# Patient Record
Sex: Female | Born: 1970 | Hispanic: Yes | Smoking: Never smoker
Health system: Southern US, Community
[De-identification: ages and names within clinical notes are randomized; demographics above are authoritative.]

## PROBLEM LIST (undated history)

## (undated) DIAGNOSIS — G43909 Migraine, unspecified, not intractable, without status migrainosus: Secondary | ICD-10-CM

## (undated) DIAGNOSIS — M069 Rheumatoid arthritis, unspecified: Secondary | ICD-10-CM

## (undated) DIAGNOSIS — E079 Disorder of thyroid, unspecified: Secondary | ICD-10-CM

## (undated) HISTORY — DX: Migraine, unspecified, not intractable, without status migrainosus: G43.909

## (undated) HISTORY — DX: Rheumatoid arthritis, unspecified: M06.9

## (undated) HISTORY — DX: Disorder of thyroid, unspecified: E07.9

---

## 1998-12-21 ENCOUNTER — Emergency Department (HOSPITAL_COMMUNITY): Admission: EM | Admit: 1998-12-21 | Discharge: 1998-12-21 | Payer: Self-pay | Admitting: Emergency Medicine

## 2000-04-21 ENCOUNTER — Ambulatory Visit (HOSPITAL_COMMUNITY): Admission: RE | Admit: 2000-04-21 | Discharge: 2000-04-21 | Payer: Self-pay | Admitting: *Deleted

## 2000-08-12 ENCOUNTER — Inpatient Hospital Stay (HOSPITAL_COMMUNITY): Admission: AD | Admit: 2000-08-12 | Discharge: 2000-08-14 | Payer: Self-pay | Admitting: Obstetrics & Gynecology

## 2000-08-16 ENCOUNTER — Inpatient Hospital Stay (HOSPITAL_COMMUNITY): Admission: AD | Admit: 2000-08-16 | Discharge: 2000-08-16 | Payer: Self-pay | Admitting: *Deleted

## 2001-04-03 ENCOUNTER — Emergency Department (HOSPITAL_COMMUNITY): Admission: EM | Admit: 2001-04-03 | Discharge: 2001-04-03 | Payer: Self-pay | Admitting: Emergency Medicine

## 2001-04-04 ENCOUNTER — Encounter: Payer: Self-pay | Admitting: Emergency Medicine

## 2002-06-11 ENCOUNTER — Encounter: Payer: Self-pay | Admitting: Emergency Medicine

## 2002-06-11 ENCOUNTER — Emergency Department (HOSPITAL_COMMUNITY): Admission: EM | Admit: 2002-06-11 | Discharge: 2002-06-12 | Payer: Self-pay | Admitting: Emergency Medicine

## 2002-09-17 ENCOUNTER — Other Ambulatory Visit: Admission: RE | Admit: 2002-09-17 | Discharge: 2002-09-17 | Payer: Self-pay | Admitting: Gynecology

## 2004-06-09 ENCOUNTER — Emergency Department (HOSPITAL_COMMUNITY): Admission: EM | Admit: 2004-06-09 | Discharge: 2004-06-09 | Payer: Self-pay | Admitting: Emergency Medicine

## 2005-02-23 ENCOUNTER — Encounter: Admission: RE | Admit: 2005-02-23 | Discharge: 2005-02-23 | Payer: Self-pay | Admitting: Occupational Medicine

## 2006-07-29 IMAGING — CR DG SHOULDER 2+V*R*
3 series · 3 of 3 positions shown · non-contrast
Comparison: none

CLINICAL DATA: Right shoulder pain after injury. 
 RIGHT SHOULDER ? 3 VIEWS:
 The AC joint is intact and the subacromial space is maintained.  There is no evidence of fracture or dislocation.

[view not recorded (1 of 3)]
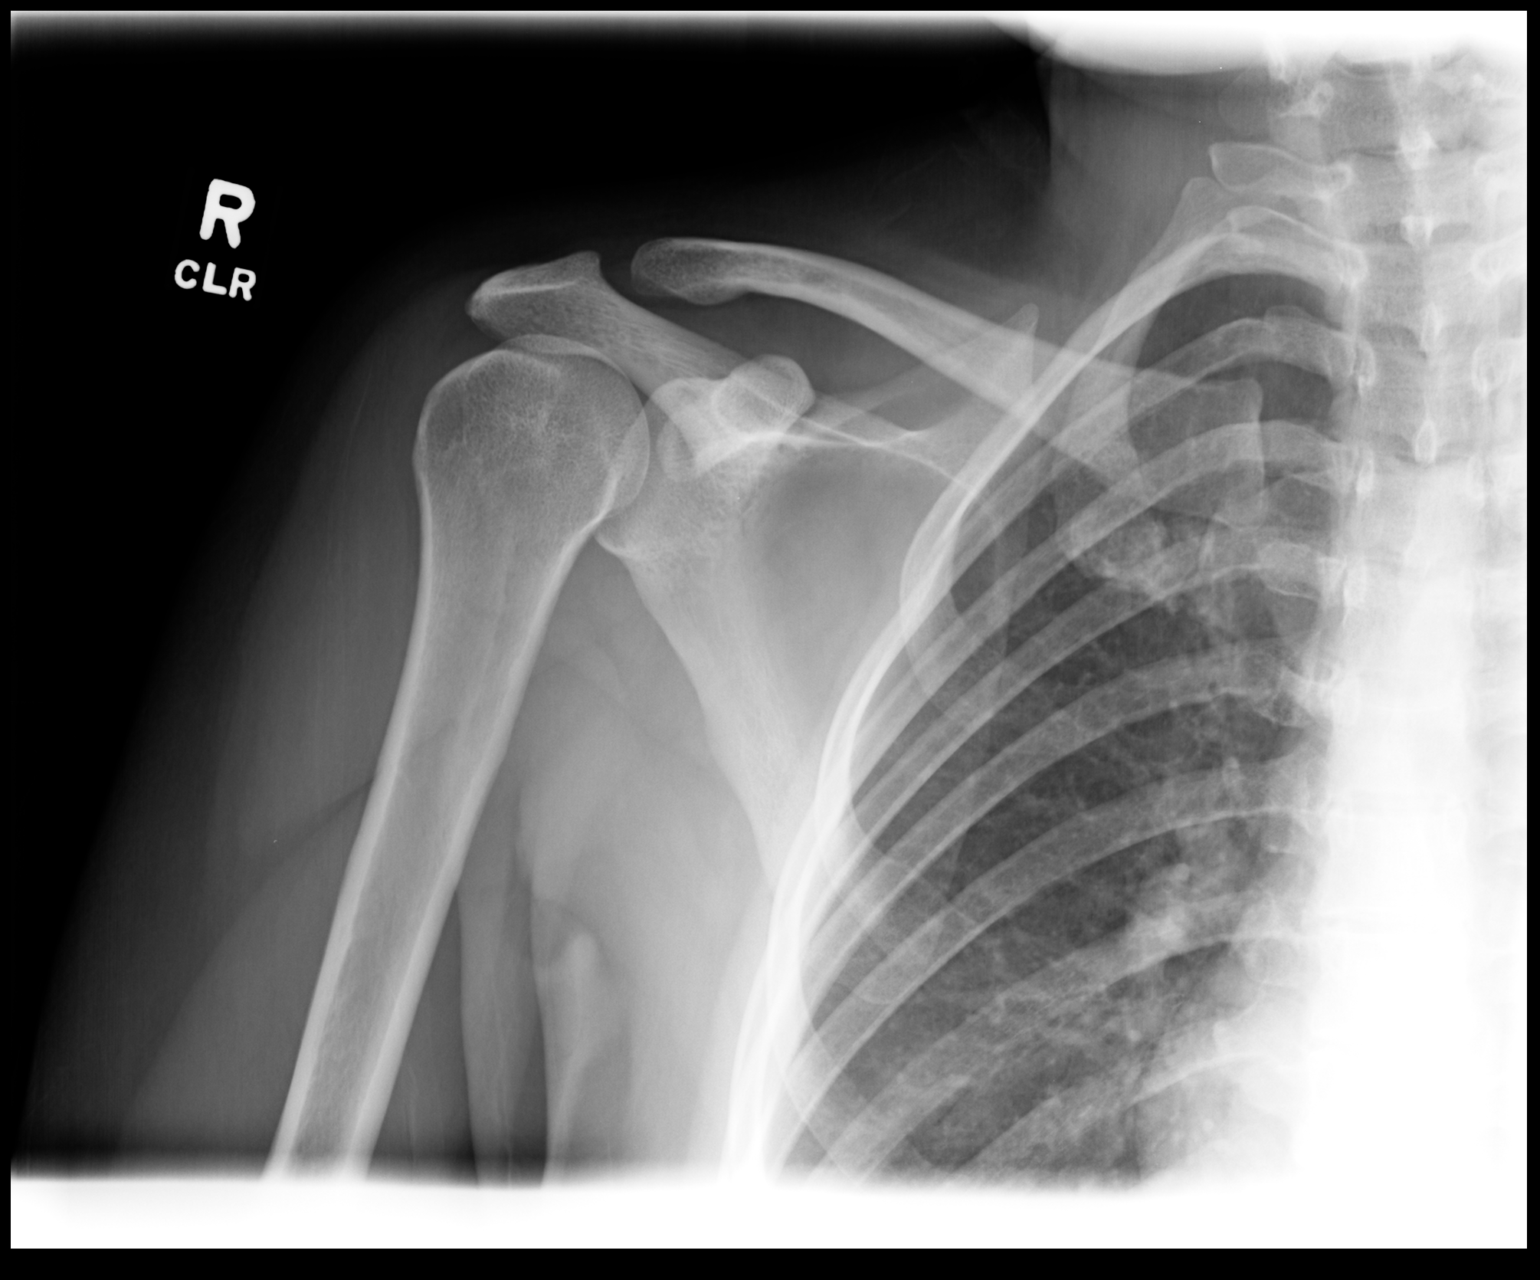

[view not recorded (2 of 3)]
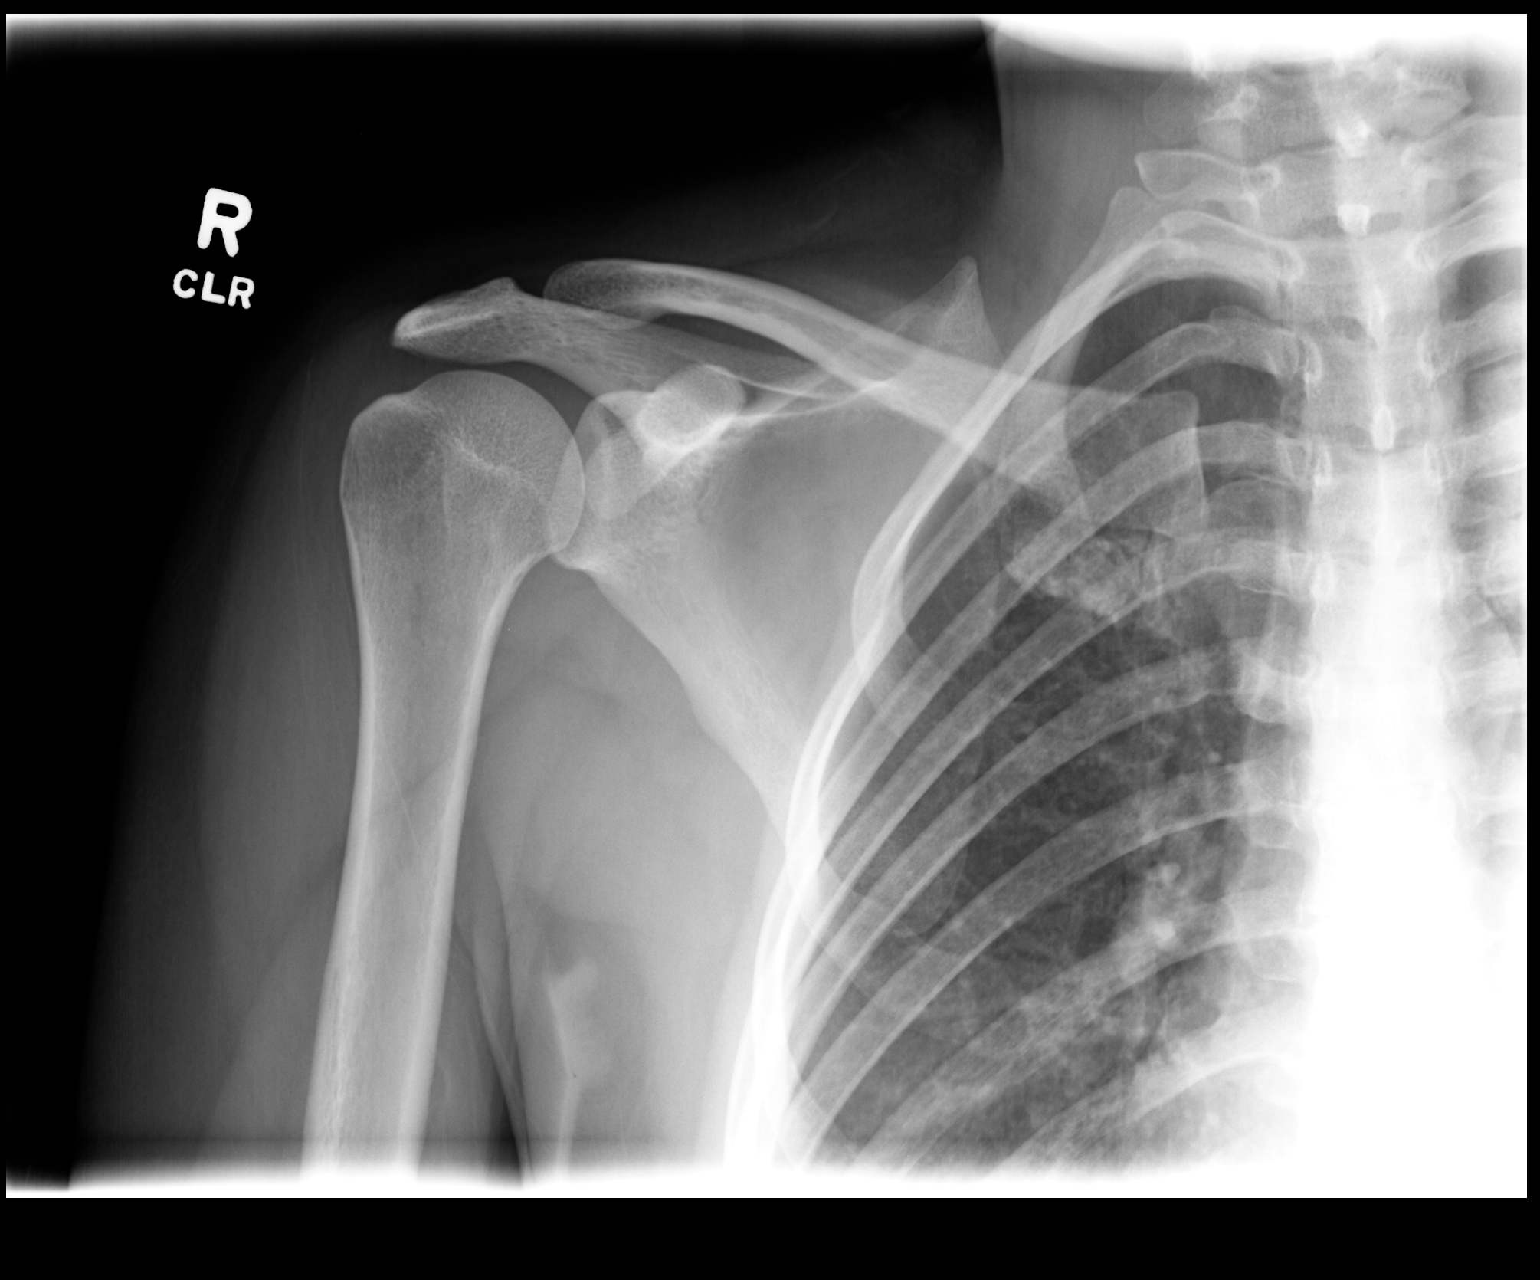

[view not recorded (3 of 3)]
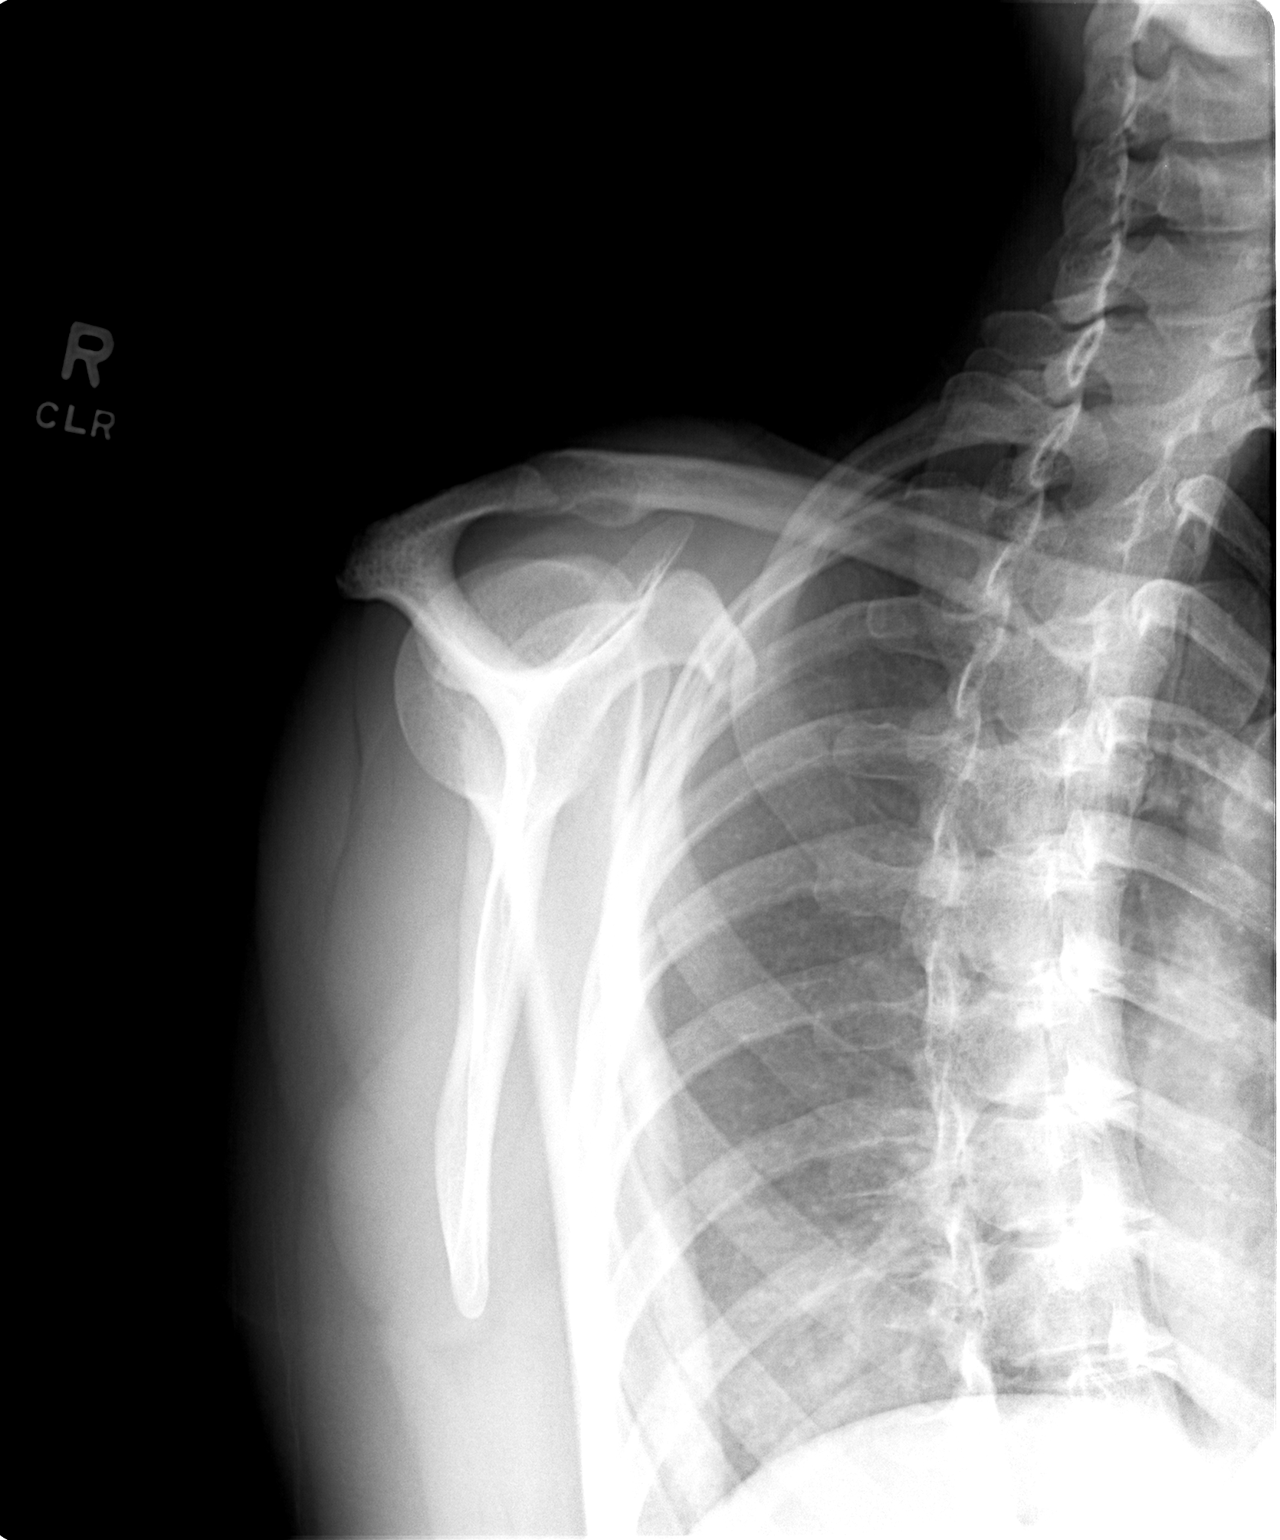

[3 of 3 positions shown; findings below may reference images not displayed]

IMPRESSION: Normal right shoulder.

## 2013-03-14 ENCOUNTER — Encounter (HOSPITAL_BASED_OUTPATIENT_CLINIC_OR_DEPARTMENT_OTHER): Payer: Self-pay

## 2013-03-14 ENCOUNTER — Emergency Department (HOSPITAL_BASED_OUTPATIENT_CLINIC_OR_DEPARTMENT_OTHER)
Admission: EM | Admit: 2013-03-14 | Discharge: 2013-03-14 | Disposition: A | Discharge status: Home / Self Care | Payer: Commercial Managed Care - PPO | Attending: Emergency Medicine | Admitting: Emergency Medicine

## 2013-03-14 DIAGNOSIS — R071 Chest pain on breathing: Secondary | ICD-10-CM | POA: Insufficient documentation

## 2013-03-14 DIAGNOSIS — R05 Cough: Secondary | ICD-10-CM | POA: Insufficient documentation

## 2013-03-14 DIAGNOSIS — R0789 Other chest pain: Secondary | ICD-10-CM

## 2013-03-14 DIAGNOSIS — R0989 Other specified symptoms and signs involving the circulatory and respiratory systems: Secondary | ICD-10-CM | POA: Insufficient documentation

## 2013-03-14 DIAGNOSIS — R0609 Other forms of dyspnea: Secondary | ICD-10-CM | POA: Insufficient documentation

## 2013-03-14 DIAGNOSIS — J209 Acute bronchitis, unspecified: Secondary | ICD-10-CM | POA: Insufficient documentation

## 2013-03-14 DIAGNOSIS — R059 Cough, unspecified: Secondary | ICD-10-CM | POA: Insufficient documentation

## 2013-03-14 LAB — COMPREHENSIVE METABOLIC PANEL
Albumin: 4 g/dL (ref 3.5–5.2)
BUN: 10 mg/dL (ref 6–23)
Chloride: 105 mEq/L (ref 96–112)
Creatinine, Ser: 0.8 mg/dL (ref 0.50–1.10)
GFR calc non Af Amer: 90 mL/min — ABNORMAL LOW (ref >90)
Total Bilirubin: 0.4 mg/dL (ref 0.3–1.2)

## 2013-03-14 LAB — CBC WITH DIFFERENTIAL/PLATELET
Basophils Relative: 0 % (ref 0–1)
Eosinophils Relative: 2 % (ref 0–5)
HCT: 40 % (ref 36.0–46.0)
Hemoglobin: 14.4 g/dL (ref 12.0–15.0)
MCH: 31.2 pg (ref 26.0–34.0)
MCHC: 36 g/dL (ref 30.0–36.0)
MCV: 86.6 fL (ref 78.0–100.0)
Monocytes Absolute: 0.7 10*3/uL (ref 0.1–1.0)
Monocytes Relative: 9 % (ref 3–12)
Neutro Abs: 5.2 10*3/uL (ref 1.7–7.7)

## 2013-03-14 LAB — TROPONIN I: Troponin I: <0.3 ng/mL (ref <0.30)

## 2013-03-14 MED ORDER — OXYCODONE-ACETAMINOPHEN 5-325 MG PO TABS: 1.0000 | ORAL_TABLET | ORAL | Status: DC | PRN

## 2013-03-14 MED ORDER — IPRATROPIUM BROMIDE 0.02 % IN SOLN
0.5000 mg | Freq: Once | RESPIRATORY_TRACT | Status: AC
  Administered 2013-03-14: 0.5 mg via RESPIRATORY_TRACT
  Filled 2013-03-14: qty 2.5

## 2013-03-14 MED ORDER — ALBUTEROL SULFATE (5 MG/ML) 0.5% IN NEBU
2.5000 mg | INHALATION_SOLUTION | Freq: Once | RESPIRATORY_TRACT | Status: AC
  Administered 2013-03-14: 2.5 mg via RESPIRATORY_TRACT
  Filled 2013-03-14: qty 0.5

## 2013-03-14 MED ORDER — ALBUTEROL SULFATE HFA 108 (90 BASE) MCG/ACT IN AERS: 2.0000 | INHALATION_SPRAY | RESPIRATORY_TRACT | Status: AC | PRN

## 2013-03-14 MED ORDER — NAPROXEN 500 MG PO TABS: 500.0000 mg | ORAL_TABLET | Freq: Two times a day (BID) | ORAL | Status: DC

## 2013-03-14 MED ORDER — KETOROLAC TROMETHAMINE 30 MG/ML IJ SOLN
30.0000 mg | Freq: Once | INTRAMUSCULAR | Status: AC
  Administered 2013-03-14: 30 mg via INTRAVENOUS
  Filled 2013-03-14: qty 1

## 2013-03-14 NOTE — ED Provider Notes (Signed)
History     CSN: [id number]  Arrival date & time 03/14/13  1021   First MD Initiated Contact with Patient 03/14/13 1127      Chief Complaint  Patient presents with  . Chest Pain    (Consider location/radiation/quality/duration/timing/severity/associated sxs/prior treatment) Patient is a 42 y.o. female presenting with chest pain. The history is provided by the patient.  Chest Pain She had onset yesterday of a sharp left-sided chest pain. It is worse in the scapular area but also present in the front. It is worse with deep breathing and worse with coughing. There is associated dyspnea but no fever, chills, sweats. Cough that is present and nonproductive. There's been no nausea, vomiting, and diarrhea. There has been no aching anywhere else. Pain is moderately severe and she rates it at 9/10. She went to an urgent care Center and was referred here. She is a nonsmoker. She has not had any recent surgery or long distance travel. She does not have history of cancer and is not on oral contraceptives. Of note, she did take a dose of ibuprofen with no relief.  History reviewed. No pertinent past medical history.  History reviewed. No pertinent past surgical history.  No family history on file.  History  Substance Use Topics  . Smoking status: Never Smoker   . Smokeless tobacco: Not on file  . Alcohol Use: No    OB History   Grav Para Term Preterm Abortions TAB SAB Ect Mult Living                  Review of Systems  Cardiovascular: Positive for chest pain.  All other systems reviewed and are negative.    Allergies  Review of patient's allergies indicates no known allergies.  Home Medications  No current outpatient prescriptions on file.  BP 113/59  Pulse 73  Temp(Src) 98.2 F (36.8 C) (Oral)  Resp 16  Ht 5\' 5"  (1.651 m)  Wt 160 lb (72.576 kg)  BMI 26.63 kg/m2  SpO2 100%  Physical Exam  Nursing note and vitals reviewed.  42 year old female, resting comfortably and  in no acute distress. Vital signs are normal. Oxygen saturation is 100%, which is normal. Head is normocephalic and atraumatic. PERRLA, EOMI. Oropharynx is clear. Neck is nontender and supple without adenopathy or JVD. Back is nontender in the midline and there is no CVA tenderness. There is moderate tenderness in the left scapular area. Lungs are clear without rales, wheezes, or rhonchi. When she coughs, mild wheezing is noted. Chest is mildly tender in the left parasternal area. Heart has regular rate and rhythm without murmur. Abdomen is soft, flat, nontender without masses or hepatosplenomegaly and peristalsis is normoactive. Extremities have no cyanosis or edema, full range of motion is present. Skin is warm and dry without rash. Neurologic: Mental status is normal, cranial nerves are intact, there are no motor or sensory deficits.  ED Course  Procedures (including critical care time)  Results for orders placed during the hospital encounter of 03/14/13  CBC WITH DIFFERENTIAL      Result Value Range   WBC 7.0  4.0 - 10.5 K/uL   RBC 4.62  3.87 - 5.11 MIL/uL   Hemoglobin 14.4  12.0 - 15.0 g/dL   HCT 11.9  14.7 - 82.9 %   MCV 86.6  78.0 - 100.0 fL   MCH 31.2  26.0 - 34.0 pg   MCHC 36.0  30.0 - 36.0 g/dL   RDW 56.2  13.0 -  15.5 %   Platelets 224  150 - 400 K/uL   Neutrophils Relative 74  43 - 77 %   Neutro Abs 5.2  1.7 - 7.7 K/uL   Lymphocytes Relative 14  12 - 46 %   Lymphs Abs 1.0  0.7 - 4.0 K/uL   Monocytes Relative 9  3 - 12 %   Monocytes Absolute 0.7  0.1 - 1.0 K/uL   Eosinophils Relative 2  0 - 5 %   Eosinophils Absolute 0.2  0.0 - 0.7 K/uL   Basophils Relative 0  0 - 1 %   Basophils Absolute 0.0  0.0 - 0.1 K/uL   ECG shows normal sinus rhythm with a rate of 66, no ectopy. Normal axis. Normal P wave. Normal QRS. Normal intervals. Normal ST and T waves. Impression: normal ECG. When compared with ECG of 06/09/2004, no significant changes are seen.   1. Acute bronchitis    2. Chest wall pain       MDM  Chest wall pain likely related to respiratory tract infection. She is PERC Negative so does not need d-dimer or a CT angiogram to rule out pulmonary embolism. She'll be given empiric trial of albuterol with Atrovent and.ketorolac for pain. Chest x-ray from her urgent care Center was reviewed and is normal by my interpretation.  She got good relief with the above-noted medication. Laboratory workup is unremarkable. She is discharged with prescription for albuterol inhaler, naproxen, and Percocet.        Dione Booze, MD 03/14/13 [phone]

## 2013-03-14 NOTE — ED Notes (Signed)
Per lab staff, labs hemolyzed.  Labs recollected.

## 2013-03-14 NOTE — ED Notes (Signed)
Pt reports an onset left upper back pain radiating to left chest wall yesterday and continuing today.  [facility], had a CXR that showed a widened mediastinum on lateral view per physician assistant.

## 2018-05-07 ENCOUNTER — Emergency Department (HOSPITAL_COMMUNITY)
Admission: EM | Admit: 2018-05-07 | Discharge: 2018-05-07 | Disposition: A | Discharge status: Home / Self Care | Payer: Worker's Compensation | Attending: Emergency Medicine | Admitting: Emergency Medicine

## 2018-05-07 ENCOUNTER — Encounter (HOSPITAL_COMMUNITY): Payer: Self-pay | Admitting: Emergency Medicine

## 2018-05-07 ENCOUNTER — Emergency Department (HOSPITAL_COMMUNITY): Payer: Worker's Compensation

## 2018-05-07 DIAGNOSIS — S6991XA Unspecified injury of right wrist, hand and finger(s), initial encounter: Secondary | ICD-10-CM

## 2018-05-07 DIAGNOSIS — S63634A Sprain of interphalangeal joint of right ring finger, initial encounter: Secondary | ICD-10-CM

## 2018-05-07 DIAGNOSIS — Y929 Unspecified place or not applicable: Secondary | ICD-10-CM | POA: Insufficient documentation

## 2018-05-07 DIAGNOSIS — W2209XA Striking against other stationary object, initial encounter: Secondary | ICD-10-CM | POA: Diagnosis not present

## 2018-05-07 DIAGNOSIS — Y990 Civilian activity done for income or pay: Secondary | ICD-10-CM | POA: Diagnosis not present

## 2018-05-07 DIAGNOSIS — Y939 Activity, unspecified: Secondary | ICD-10-CM | POA: Insufficient documentation

## 2018-05-07 DIAGNOSIS — Z79899 Other long term (current) drug therapy: Secondary | ICD-10-CM | POA: Insufficient documentation

## 2018-05-07 MED ORDER — IBUPROFEN 600 MG PO TABS: 600.0000 mg | ORAL_TABLET | Freq: Four times a day (QID) | ORAL | 0 refills | Status: AC | PRN

## 2018-05-07 MED ORDER — IBUPROFEN 200 MG PO TABS
600.0000 mg | ORAL_TABLET | Freq: Once | ORAL | Status: AC
  Administered 2018-05-07: 600 mg via ORAL
  Filled 2018-05-07: qty 3

## 2018-05-07 NOTE — Discharge Instructions (Signed)
X-ray shows no evidence of fracture dislocation this is likely a finger sprain.  You may use the splint to help protect the finger while it heals.  Ice and elevate the finger as much as possible you may use ibuprofen as needed for pain as well as Tylenol.  If pain is not improving please follow-up with your primary care doctor for reevaluation.  If you have severely worsened pain, redness warmth or swelling of the finger that spreads onto the hand, fevers or any other new or concerning symptoms return to the ED for reevaluation.

## 2018-05-07 NOTE — ED Triage Notes (Addendum)
Patient reports hitting right fourth finger on trash can at work yesterday. Minimal swelling noted to finger. Movement and sensation present.

## 2018-05-07 NOTE — ED Provider Notes (Signed)
[facility]-EMERGENCY DEPT Provider Note   CSN: [id number] Arrival date & time: 05/07/18  1500     History   Chief Complaint Chief Complaint  Patient presents with  . Finger Injury    HPI Lindsey Austin is a 47 y.o. female.  Lindsey Austin is a 47 y.o. Female who is otherwise healthy, presents to the emergency department for evaluation of pain and swelling in her right ring finger.  She reports while at work yesterday she jammed it on a trash can and since then she has had a constant dull ache, pain is made worse with palpation or range of motion.  Patient reports some mild decreased sensation intermittently, denies any tingling or weakness, she is able to minimally bend and extend the finger, but this is limited by pain.  She denies any cuts or abrasions to the finger, no redness or warmth associated with the swelling.  No pain in the hand or injury to any other fingers, no previous injury or surgeries.  She has not taken anything prior to arrival to treat her pain, no other aggravating or alleviating factors.     History reviewed. No pertinent past medical history.  There are no active problems to display for this patient.   History reviewed. No pertinent surgical history.   OB History   None      Home Medications    Prior to Admission medications   Medication Sig Start Date End Date Taking? Authorizing Provider  albuterol (PROVENTIL HFA;VENTOLIN HFA) 108 (90 BASE) MCG/ACT inhaler Inhale 2 puffs into the lungs every 4 (four) hours as needed for wheezing or shortness of breath (or coughing). 03/14/13   Dione Booze, MD  naproxen (NAPROSYN) 500 MG tablet Take 1 tablet (500 mg total) by mouth 2 (two) times daily. 03/14/13   Dione Booze, MD  oxyCODONE-acetaminophen (ROXICET) 5-325 MG per tablet Take 1 tablet by mouth every 4 (four) hours as needed for pain. 03/14/13   Dione Booze, MD    Family History No family history on file.  Social History Social  History   Tobacco Use  . Smoking status: Never Smoker  Substance Use Topics  . Alcohol use: No  . Drug use: No     Allergies   Patient has no known allergies.   Review of Systems Review of Systems  Constitutional: Negative for chills and fever.  Musculoskeletal: Positive for arthralgias and joint swelling.  Skin: Negative for color change, rash and wound.  Neurological: Negative for weakness and numbness.     Physical Exam Updated Vital Signs BP 129/80 (BP Location: Left Arm)   Pulse 82   Temp 98.3 F (36.8 C) (Oral)   Resp 16   Ht  (1.626 m)   Wt 71.7 kg (158 lb)   LMP 04/30/2018   SpO2 97%   BMI 27.12 kg/m   Physical Exam  Constitutional: She appears well-developed and well-nourished. No distress.  HENT:  Head: Normocephalic and atraumatic.  Eyes: Right eye exhibits no discharge. Left eye exhibits no discharge.  Pulmonary/Chest: Effort normal. No respiratory distress.  Musculoskeletal:  Mild swelling noted to the right ring finger, tenderness to palpation throughout but worst at the PIP joint, no overlying erythema or warmth, no cuts or abrasions noted, patient is able to flex and extend the finger somewhat at each joint, but this is limited by pain, sensation intact throughout the finger, 2+ radial pulse with good capillary refill.  Neurological: She is alert. Coordination normal.  Skin:  Skin is warm and dry. Capillary refill takes less than 2 seconds. She is not diaphoretic.  Psychiatric: She has a normal mood and affect. Her behavior is normal.  Nursing note and vitals reviewed.    ED Treatments / Results  Labs (all labs ordered are listed, but only abnormal results are displayed) Labs Reviewed - No data to display  EKG None  Radiology Dg Finger Ring Right  Result Date: 05/07/2018 CLINICAL DATA:  Struck fourth finger on trash can yesterday. EXAM: RIGHT RING FINGER 2+V COMPARISON:  RIGHT hand radiograph October 06, 2017 FINDINGS: There is no  evidence of fracture or dislocation. There is no evidence of arthropathy or other focal bone abnormality. Soft tissue swelling, no subcutaneous gas or radiopaque foreign bodies. IMPRESSION: Soft tissue swelling, no acute osseous process. Electronically Signed   By: Awilda Metro M.D.   On: 05/07/2018 16:08    Procedures Procedures (including critical care time)  Medications Ordered in ED Medications  ibuprofen (ADVIL,MOTRIN) tablet 600 mg (600 mg Oral Given 05/07/18 1630)     Initial Impression / Assessment and Plan / ED Course  I have reviewed the triage vital signs and the nursing notes.  Pertinent labs & imaging results that were available during my care of the patient were reviewed by me and considered in my medical decision making (see chart for details).  Patient presents to the ED for evaluation of pain in her right ring finger after striking it on a trash can yesterday at work.  Finger is neurovascularly intact, range of motion is limited by pain.  X-ray shows no evidence of fracture dislocation and soft tissues are unremarkable.  Exam is not concerning for infection.  I think this is likely a finger sprain will place patient in finger splint for comfort.  Ibuprofen and Tylenol for pain.  Patient encouraged to ice and elevate.  Patient to follow-up with her primary care doctor.  Return precautions discussed.  Final Clinical Impressions(s) / ED Diagnoses   Final diagnoses:  Sprain of interphalangeal joint of right ring finger, initial encounter  Injury of finger of right hand, initial encounter    ED Discharge Orders        Ordered    ibuprofen (ADVIL,MOTRIN) 600 MG tablet  Every 6 hours PRN     05/07/18 1623       Dartha Lodge, PA-C 05/07/18 1735    Tilden Fossa, MD 05/10/18 1235

## 2019-10-10 IMAGING — CR DG FINGER RING 2+V*R*
3 series · 3 of 3 positions shown · non-contrast
Comparison: RIGHT hand radiograph October 06, 2017

CLINICAL DATA: Struck fourth finger on trash can yesterday.

EXAM:
RIGHT RING FINGER 2+V

[x finger pa right]
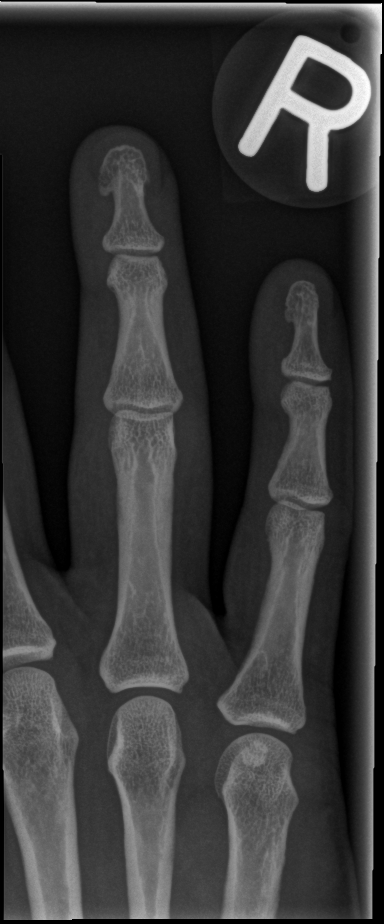

[x finger obl right]
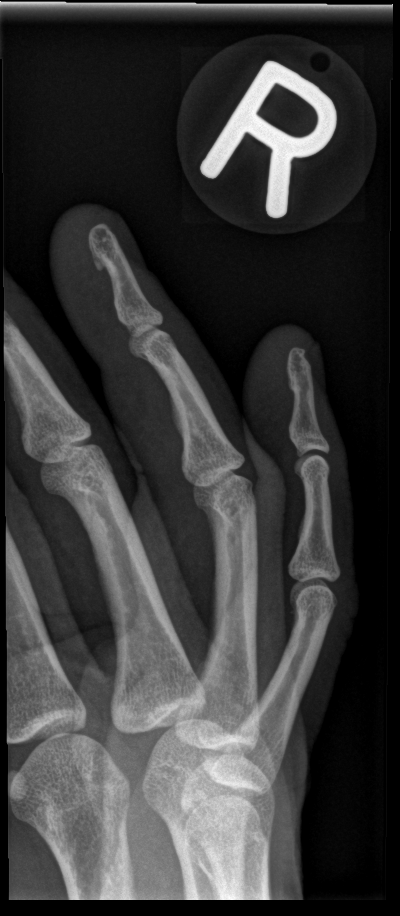

[x finger lat right]
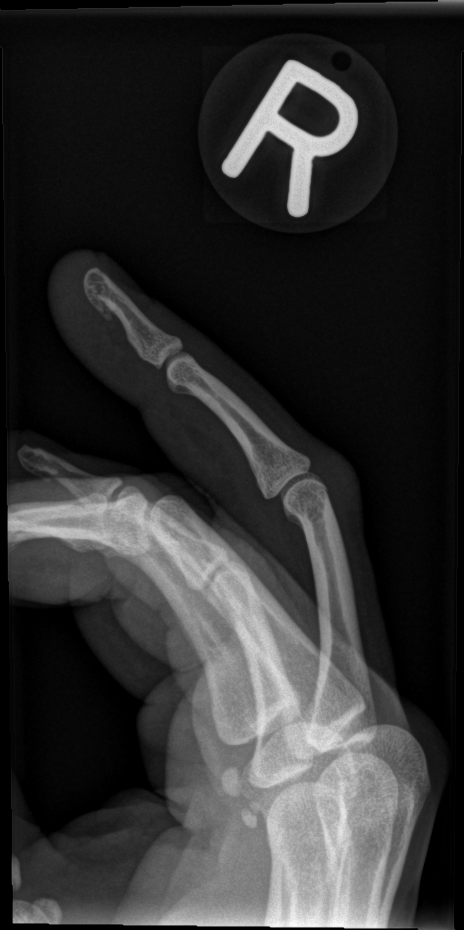

[3 of 3 positions shown; findings below may reference images not displayed]

FINDINGS: There is no evidence of fracture or dislocation. There is no
evidence of arthropathy or other focal bone abnormality. Soft tissue
swelling, no subcutaneous gas or radiopaque foreign bodies.
IMPRESSION: Soft tissue swelling, no acute osseous process.

## 2021-07-20 ENCOUNTER — Encounter: Payer: Self-pay | Admitting: Emergency Medicine

## 2021-07-20 ENCOUNTER — Other Ambulatory Visit: Payer: Self-pay

## 2021-07-20 ENCOUNTER — Ambulatory Visit (INDEPENDENT_AMBULATORY_CARE_PROVIDER_SITE_OTHER): Payer: BC Managed Care – PPO | Admitting: Emergency Medicine

## 2021-07-20 VITALS — BP 130/70 | HR 63 | Temp 98.1°F | Ht 62.25 in | Wt 158.4 lb

## 2021-07-20 DIAGNOSIS — F32A Depression, unspecified: Secondary | ICD-10-CM | POA: Diagnosis not present

## 2021-07-20 DIAGNOSIS — Z7689 Persons encountering health services in other specified circumstances: Secondary | ICD-10-CM | POA: Diagnosis not present

## 2021-07-20 DIAGNOSIS — N951 Menopausal and female climacteric states: Secondary | ICD-10-CM

## 2021-07-20 DIAGNOSIS — R531 Weakness: Secondary | ICD-10-CM | POA: Diagnosis not present

## 2021-07-20 DIAGNOSIS — E039 Hypothyroidism, unspecified: Secondary | ICD-10-CM

## 2021-07-20 DIAGNOSIS — Z8739 Personal history of other diseases of the musculoskeletal system and connective tissue: Secondary | ICD-10-CM

## 2021-07-20 MED ORDER — BUPROPION HCL ER (SR) 150 MG PO TB12: 150.0000 mg | ORAL_TABLET | Freq: Two times a day (BID) | ORAL | 1 refills | Status: AC

## 2021-07-20 NOTE — Progress Notes (Signed)
Lindsey Austin 50 y.o.   Chief Complaint  Patient presents with   New Patient (Initial Visit)    Establish care    HISTORY OF PRESENT ILLNESS: This is a 50 y.o. female first visit to this office, here to establish care with me. Changing PCPs due to changes in medical insurance. Has the following chronic medical problems: 1.  Hypothyroidism, presently on Synthroid 50 mcg daily 2.  History of arthritis and chronic joint pains.  Meloxicam sometimes helps. 3.  Perimenopause: Needs GYN referral 4.  Chronic depression since age 74.  Has been on different medications.  No formal psychiatric evaluation or referral ever made. Today complaining of general weakness and tiredness with muscle aches and diminished sleep for the last 3 months. Decreased libido "all my life". No other complaints or medical concerns today.  HPI   Prior to Admission medications   Medication Sig Start Date End Date Taking? Authorizing Provider  eletriptan (RELPAX) 20 MG tablet Take by mouth. 02/18/20  Yes [provider]  Hyoscyamine Sulfate SL 0.125 MG SUBL Take by mouth. 02/23/19  Yes [provider]  levothyroxine (SYNTHROID) 50 MCG tablet Take 50 mcg by mouth every morning. 04/10/21  Yes [provider]  meloxicam (MOBIC) 7.5 MG tablet TOME UNA TABLETA TODOS LOS DIAS 01/02/20  Yes [provider]  albuterol (PROVENTIL HFA;VENTOLIN HFA) 108 (90 BASE) MCG/ACT inhaler Inhale 2 puffs into the lungs every 4 (four) hours as needed for wheezing or shortness of breath (or coughing). Patient not taking: Reported on 07/20/2021 03/14/13   Dione Booze, MD  ibuprofen (ADVIL,MOTRIN) 600 MG tablet Take 1 tablet (600 mg total) by mouth every 6 (six) hours as needed. Patient not taking: Reported on 07/20/2021 05/07/18   Dartha Lodge, PA-C    Allergies  Allergen Reactions   Acetaminophen Hives   Meloxicam     Other reaction(s): GI Upset (intolerance)   Sumatriptan     Other reaction(s): Fever  (intolerance), GI Upset (intolerance)    There are no problems to display for this patient.   Past Medical History:  Diagnosis Date   Migraine    Rheumatoid arthritis (HCC)    Thyroid disease     No past surgical history on file.  Social History   Socioeconomic History   Marital status: Legally Separated    Spouse name: Not on file   Number of children: Not on file   Years of education: Not on file   Highest education level: Not on file  Occupational History   Not on file  Tobacco Use   Smoking status: Never   Smokeless tobacco: Never  Vaping Use   Vaping Use: Never used  Substance and Sexual Activity   Alcohol use: No   Drug use: Never   Sexual activity: Not on file  Other Topics Concern   Not on file  Social History Narrative   Not on file   Social Determinants of Health   Financial Resource Strain: Not on file  Food Insecurity: Not on file  Transportation Needs: Not on file  Physical Activity: Not on file  Stress: Not on file  Social Connections: Not on file  Intimate Partner Violence: Not on file    No family history on file.   Review of Systems  Constitutional: Negative.  Negative for chills and fever.  HENT: Negative.  Negative for congestion and sore throat.   Respiratory: Negative.  Negative for cough and shortness of breath.   Cardiovascular:  Negative for  chest pain and palpitations.  Gastrointestinal:  Negative for abdominal pain, diarrhea, nausea and vomiting.  Genitourinary: Negative.  Negative for dysuria and hematuria.  Musculoskeletal:  Positive for joint pain.  Skin: Negative.  Negative for rash.  Neurological:  Negative for dizziness.  Psychiatric/Behavioral:  Positive for depression. Negative for suicidal ideas.   All other systems reviewed and are negative.  Today's Vitals   07/20/21 1530  BP: 130/70  Pulse: 63  Temp: 98.1 F (36.7 C)  TempSrc: Oral  SpO2: 96%  Weight: 158 lb 6.4 oz (71.8 kg)  Height: 5' 2.25" (1.581 m)    Body mass index is 28.74 kg/m.  Physical Exam Vitals reviewed.  Constitutional:      Appearance: Normal appearance.  HENT:     Head: Normocephalic.  Eyes:     Extraocular Movements: Extraocular movements intact.     Conjunctiva/sclera: Conjunctivae normal.     Pupils: Pupils are equal, round, and reactive to light.  Neck:     Vascular: No carotid bruit.  Cardiovascular:     Rate and Rhythm: Normal rate and regular rhythm.     Pulses: Normal pulses.     Heart sounds: Normal heart sounds.  Pulmonary:     Effort: Pulmonary effort is normal.     Breath sounds: Normal breath sounds.  Musculoskeletal:        General: Normal range of motion.     Cervical back: Normal range of motion and neck supple. No tenderness.  Lymphadenopathy:     Cervical: No cervical adenopathy.  Skin:    General: Skin is warm and dry.     Capillary Refill: Capillary refill takes less than 2 seconds.  Neurological:     General: No focal deficit present.     Mental Status: She is alert and oriented to person, place, and time.  Psychiatric:        Mood and Affect: Mood normal.        Behavior: Behavior normal.     ASSESSMENT & PLAN: A total of 51 minutes was spent with the patient and counseling/coordination of care regarding establishing care with me, review of extensive past medical history and chronic medical problems, review of all medications, comprehensive history and physical examination, health maintenance items, education on nutrition, prognosis, documentation, need for referrals to gynecologist and psychiatrist, and need for follow-up.  Hypothyroidism Clinically euthyroid.  Continue Synthroid 50 mcg daily Blood work done today.  General weakness Multifactorial.  Differential diagnosis discussed with patient. Blood work done today.   Chronic depression Chronic depression and anxiety since age 55. Needs evaluation by psychiatrist.  Has failed multiple medications in the past as per  patient. Will try Wellbutrin 150 mg twice a day.  It may help with her libido as well.  Perimenopausal Multiple symptoms including decreased libido affecting quality of life. Needs GYN evaluation.  Shaynah was seen today for new patient (initial visit).  Diagnoses and all orders for this visit:  General weakness -     CBC with Differential/Platelet -     Comprehensive metabolic panel -     TSH -     Hemoglobin A1c -     Lipid panel  Encounter to establish care  Hypothyroidism, unspecified type  Chronic depression -     buPROPion (WELLBUTRIN SR) 150 MG 12 hr tablet; Take 1 tablet (150 mg total) by mouth 2 (two) times daily. -     Ambulatory referral to Psychiatry  Perimenopausal -     Ambulatory  referral to Gynecology  History of rheumatoid arthritis  Patient Instructions  Mantenimiento de la salud en las mujeres Health Maintenance, Female Adoptar un estilo de vida saludable y recibir atencin preventiva son importantes para promover la salud y Counsellorel bienestar. Consulte al mdico sobre: El esquema adecuado para hacerse pruebas y exmenes peridicos. Cosas que puede hacer por su cuenta para prevenir enfermedades y Thrivent Financialmantenerse sana. Qu debo saber sobre la dieta, el peso y el ejercicio? Consuma una dieta saludable  Consuma una dieta que incluya muchas verduras, frutas, productos lcteos con bajo contenido de [place] y Associate Professorprotenas magras. No consuma muchos alimentos ricos en grasas slidas, azcares agregados o sodio.  Mantenga un peso saludable El ndice de masa muscular [facility](IMC) se [place]utiliza para identificar problemas de [place]peso. Proporciona una estimacin de la grasa corporal basndose en el peso y la altura. Su mdico puede ayudarle a Engineer, sitedeterminar su IMC y a Personnel officerlograr o Pharmacologistmantener unpeso saludable. Haga ejercicio con regularidad Haga ejercicio con regularidad. Esta es una de las prcticas ms importantes que puede hacer por su salud. La Harley-Davidsonmayora de los adultos deben seguir estas  pautas: Education officer, environmentalealizar, al menos, 150 minutos de actividad fsica por semana. El ejercicio debe aumentar la frecuencia cardaca y Media plannerhacerlo transpirar (ejercicio de intensidad moderada). Hacer ejercicios de fortalecimiento por lo Rite Aidmenos dos veces por semana. Agregue esto a su plan de ejercicio de intensidad moderada. Pasar menos tiempo sentados. Incluso la actividad fsica ligera puede ser beneficiosa. Controle sus niveles de colesterol y lpidos en la sangre Comience a realizarse anlisis de lpidos y colesterol en la sangre a los20 aos y luego reptalos cada 5 aos. Hgase controlar los niveles de colesterol con mayor frecuencia si: Sus niveles de lpidos y colesterol son altos. Es mayor de 40 aos. Presenta un alto riesgo de padecer enfermedades cardacas. Qu debo saber sobre las pruebas de deteccin del cncer? Segn su historia clnica y sus antecedentes familiares, es posible que deba realizarse pruebas de deteccin del cncer en diferentes edades. Esto puede incluir pruebas de deteccin de lo siguiente: Cncer de mama. Cncer de cuello uterino. Cncer colorrectal. Cncer de piel. Cncer de pulmn. Qu debo saber sobre la enfermedad cardaca, la diabetes y la hipertensinarterial? Presin arterial y enfermedad cardaca La hipertensin arterial causa enfermedades cardacas y Lesothoaumenta el riesgo de accidente cerebrovascular. Es ms probable que esto se manifieste en las personas que tienen lecturas de presin arterial alta, tienen ascendencia africana o tienen sobrepeso. Hgase controlar la presin arterial: Cada 3 a 5 aos si tiene entre 18 y 5739 aos. Todos los aos si es mayor de 40 aos. Diabetes Realcese exmenes de deteccin de la diabetes con regularidad. Este anlisis revisa el nivel de azcar en la sangre en [place]. Hgase las pruebas de deteccin: Cada tres aos despus de los 40 aos de edad si tiene un peso normal y un bajo riesgo de padecer diabetes. Con ms frecuencia y a partir  de [place] edad inferior si tiene sobrepeso o un alto riesgo de padecer diabetes. Qu debo saber sobre la prevencin de infecciones? Hepatitis B Si tiene un riesgo ms alto de contraer hepatitis B, debe someterse a un examen de deteccin de este virus. Hable con el mdico para averiguar si tiene riesgode contraer la infeccin por hepatitis B. Hepatitis C Se recomienda el anlisis a: Celanese Corporationodos los que nacieron entre 1945 y 1965. Todas las personas que tengan un riesgo de haber contrado hepatitis C. Enfermedades de transmisin sexual (ETS) Hgase las pruebas de deteccin de ITS, incluidas la  gonorrea y la clamidia, si: Es sexualmente activa y es menor de [address]. Es mayor de [address], y Public affairs consultant informa que corre riesgo de tener este tipo de infecciones. La actividad sexual ha cambiado desde que le hicieron la ltima prueba de deteccin y tiene un riesgo mayor de Warehouse manager clamidia o Copy. Pregntele al mdico si usted tiene riesgo. Pregntele al mdico si usted tiene un alto riesgo de Primary school teacher VIH. El mdico tambin puede recomendarle un medicamento recetado para ayudar a evitar la infeccin por el VIH. Si elige tomar medicamentos para prevenir el VIH, primero debe [employer] de deteccin del VIH. Luego debe hacerse anlisis cada 3 meses mientras est tomando los medicamentos. Embarazo Si est por dejar de Armed forces training and education officer (fase premenopusica) y usted puede quedar [place], busque asesoramiento antes de [place]. Tome de 400 a 800 microgramos (mcg) de cido [employer] si [place]. Pida mtodos de control de la natalidad (anticonceptivos) si desea evitar un embarazo no deseado. Osteoporosis y [place] La osteoporosis es una enfermedad en la que los huesos pierden los minerales y la fuerza por el avance de la edad. El resultado pueden ser fracturas en los [place]. Si tiene 65 aos o ms, o si est en riesgo de sufrir osteoporosis y fracturas, pregunte a su mdico si  debe: Hacerse pruebas de deteccin de prdida sea. Tomar un suplemento de calcio o de vitamina D para reducir el riesgo de fracturas. Recibir terapia de reemplazo hormonal (TRH) para tratar los sntomas de la menopausia. Siga estas instrucciones en su casa: Estilo de vida No consuma ningn producto que contenga nicotina o tabaco, como cigarrillos, cigarrillos electrnicos y tabaco de Theatre manager. Si necesita ayuda para dejar de fumar, consulte al mdico. No consuma drogas. No comparta agujas. Solicite ayuda a su mdico si necesita apoyo o informacin para abandonar las drogas. Consumo de alcohol No beba alcohol si: Su mdico le indica no hacerlo. Est embarazada, puede estar embarazada o est tratando de [place]. Si bebe alcohol: Limite la cantidad que consume de 0 a 1 medida por da. Limite la ingesta si est amamantando. Est atento a la cantidad de alcohol que hay en las bebidas que toma. En los [address], una medida equivale a una botella de cerveza de 12 oz (355 ml), un vaso de vino de 5 oz (148 ml) o un vaso de una bebida alcohlica de alta graduacin de 1 oz (44 ml). Instrucciones generales Realcese los estudios de rutina de la salud, dentales y de Wellsite geologist. Mantngase al da con las vacunas. Infrmele a su mdico si: Se siente deprimida con frecuencia. Alguna vez ha sido vctima de [place] o no se siente segura en su casa. Resumen Adoptar un estilo de vida saludable y recibir atencin preventiva son importantes para promover la salud y Counsellor. Siga las instrucciones del mdico acerca de una dieta saludable, el ejercicio y la realizacin de pruebas o exmenes para Hotel manager. Siga las instrucciones del mdico con respecto al control del colesterol y la presin arterial. Esta informacin no tiene Theme park manager el consejo del mdico. Asegresede hacerle al mdico cualquier pregunta que tenga. Document Revised: 12/27/2018 Document Reviewed:  12/27/2018 Elsevier Patient Education  2022 Elsevier Inc.   Edwina Barth, MD [facility]

## 2021-07-20 NOTE — Assessment & Plan Note (Signed)
Multifactorial.  Differential diagnosis discussed with patient. Blood work done today.

## 2021-07-20 NOTE — Assessment & Plan Note (Signed)
Multiple symptoms including decreased libido affecting quality of life. Needs GYN evaluation.

## 2021-07-20 NOTE — Patient Instructions (Signed)
Mantenimiento de la salud en las mujeres Health Maintenance, Female Adoptar un estilo de vida saludable y recibir atencin preventiva son importantes para promover la salud y el bienestar. Consulte al mdico sobre: El esquema adecuado para hacerse pruebas y exmenes peridicos. Cosas que puede hacer por su cuenta para prevenir enfermedades y mantenerse sana. Qu debo saber sobre la dieta, el peso y el ejercicio? Consuma una dieta saludable  Consuma una dieta que incluya muchas verduras, frutas, productos lcteos con bajo contenido de grasa y protenas magras. No consuma muchos alimentos ricos en grasas slidas, azcares agregados o sodio.  Mantenga un peso saludable El ndice de masa muscular (IMC) se utiliza para identificar problemas de peso. Proporciona una estimacin de la grasa corporal basndose en el peso y la altura. Su mdico puede ayudarle a determinar su IMC y a lograr o mantener unpeso saludable. Haga ejercicio con regularidad Haga ejercicio con regularidad. Esta es una de las prcticas ms importantes que puede hacer por su salud. La mayora de los adultos deben seguir estas pautas: Realizar, al menos, 150minutos de actividad fsica por semana. El ejercicio debe aumentar la frecuencia cardaca y hacerlo transpirar (ejercicio de intensidad moderada). Hacer ejercicios de fortalecimiento por lo menos dos veces por semana. Agregue esto a su plan de ejercicio de intensidad moderada. Pasar menos tiempo sentados. Incluso la actividad fsica ligera puede ser beneficiosa. Controle sus niveles de colesterol y lpidos en la sangre Comience a realizarse anlisis de lpidos y colesterol en la sangre a los20aos y luego reptalos cada 5aos. Hgase controlar los niveles de colesterol con mayor frecuencia si: Sus niveles de lpidos y colesterol son altos. Es mayor de 40aos. Presenta un alto riesgo de padecer enfermedades cardacas. Qu debo saber sobre las pruebas de deteccin del  cncer? Segn su historia clnica y sus antecedentes familiares, es posible que deba realizarse pruebas de deteccin del cncer en diferentes edades. Esto puede incluir pruebas de deteccin de lo siguiente: Cncer de mama. Cncer de cuello uterino. Cncer colorrectal. Cncer de piel. Cncer de pulmn. Qu debo saber sobre la enfermedad cardaca, la diabetes y la hipertensinarterial? Presin arterial y enfermedad cardaca La hipertensin arterial causa enfermedades cardacas y aumenta el riesgo de accidente cerebrovascular. Es ms probable que esto se manifieste en las personas que tienen lecturas de presin arterial alta, tienen ascendencia africana o tienen sobrepeso. Hgase controlar la presin arterial: Cada 3 a 5 aos si tiene entre 18 y 39 aos. Todos los aos si es mayor de 40aos. Diabetes Realcese exmenes de deteccin de la diabetes con regularidad. Este anlisis revisa el nivel de azcar en la sangre en ayunas. Hgase las pruebas de deteccin: Cada tresaos despus de los 40aos de edad si tiene un peso normal y un bajo riesgo de padecer diabetes. Con ms frecuencia y a partir de una edad inferior si tiene sobrepeso o un alto riesgo de padecer diabetes. Qu debo saber sobre la prevencin de infecciones? Hepatitis B Si tiene un riesgo ms alto de contraer hepatitis B, debe someterse a un examen de deteccin de este virus. Hable con el mdico para averiguar si tiene riesgode contraer la infeccin por hepatitis B. Hepatitis C Se recomienda el anlisis a: Todos los que nacieron entre 1945 y 1965. Todas las personas que tengan un riesgo de haber contrado hepatitis C. Enfermedades de transmisin sexual (ETS) Hgase las pruebas de deteccin de ITS, incluidas la gonorrea y la clamidia, si: Es sexualmente activa y es menor de 24aos. Es mayor de 24aos, y el mdico   le informa que corre riesgo de tener este tipo de infecciones. La actividad sexual ha cambiado desde que le  hicieron la ltima prueba de deteccin y tiene un riesgo mayor de tener clamidia o gonorrea. Pregntele al mdico si usted tiene riesgo. Pregntele al mdico si usted tiene un alto riesgo de contraer VIH. El mdico tambin puede recomendarle un medicamento recetado para ayudar a evitar la infeccin por el VIH. Si elige tomar medicamentos para prevenir el VIH, primero debe hacerse los anlisis de deteccin del VIH. Luego debe hacerse anlisis cada 3meses mientras est tomando los medicamentos. Embarazo Si est por dejar de menstruar (fase premenopusica) y usted puede quedar embarazada, busque asesoramiento antes de quedar embarazada. Tome de 400 a 800microgramos (mcg) de cido flico todos los das si queda embarazada. Pida mtodos de control de la natalidad (anticonceptivos) si desea evitar un embarazo no deseado. Osteoporosis y menopausia La osteoporosis es una enfermedad en la que los huesos pierden los minerales y la fuerza por el avance de la edad. El resultado pueden ser fracturas en los huesos. Si tiene 65aos o ms, o si est en riesgo de sufrir osteoporosis y fracturas, pregunte a su mdico si debe: Hacerse pruebas de deteccin de prdida sea. Tomar un suplemento de calcio o de vitamina D para reducir el riesgo de fracturas. Recibir terapia de reemplazo hormonal (TRH) para tratar los sntomas de la menopausia. Siga estas instrucciones en su casa: Estilo de vida No consuma ningn producto que contenga nicotina o tabaco, como cigarrillos, cigarrillos electrnicos y tabaco de mascar. Si necesita ayuda para dejar de fumar, consulte al mdico. No consuma drogas. No comparta agujas. Solicite ayuda a su mdico si necesita apoyo o informacin para abandonar las drogas. Consumo de alcohol No beba alcohol si: Su mdico le indica no hacerlo. Est embarazada, puede estar embarazada o est tratando de quedar embarazada. Si bebe alcohol: Limite la cantidad que consume de 0 a 1 medida por  da. Limite la ingesta si est amamantando. Est atento a la cantidad de alcohol que hay en las bebidas que toma. En los [place], una medida equivale a una botella de cerveza de 12oz (355ml), un vaso de vino de 5oz (148ml) o un vaso de una bebida alcohlica de alta graduacin de 1oz (44ml). Instrucciones generales Realcese los estudios de rutina de la salud, dentales y de la vista. Mantngase al da con las vacunas. Infrmele a su mdico si: Se siente deprimida con frecuencia. Alguna vez ha sido vctima de maltrato o no se siente segura en su casa. Resumen Adoptar un estilo de vida saludable y recibir atencin preventiva son importantes para promover la salud y el bienestar. Siga las instrucciones del mdico acerca de una dieta saludable, el ejercicio y la realizacin de pruebas o exmenes para detectar enfermedades. Siga las instrucciones del mdico con respecto al control del colesterol y la presin arterial. Esta informacin no tiene como fin reemplazar el consejo del mdico. Asegresede hacerle al mdico cualquier pregunta que tenga. Document Revised: 12/27/2018 Document Reviewed: 12/27/2018 Elsevier Patient Education  2022 Elsevier Inc.  

## 2021-07-20 NOTE — Assessment & Plan Note (Signed)
Clinically euthyroid.  Continue Synthroid 50 mcg daily.  Blood work done today. 

## 2021-07-20 NOTE — Assessment & Plan Note (Signed)
Chronic depression and anxiety since age 50. Needs evaluation by psychiatrist.  Has failed multiple medications in the past as per patient. Will try Wellbutrin 150 mg twice a day.  It may help with her libido as well.

## 2021-07-21 LAB — COMPREHENSIVE METABOLIC PANEL
ALT: 19 U/L (ref 0–35)
AST: 17 U/L (ref 0–37)
Albumin: 4.1 g/dL (ref 3.5–5.2)
Alkaline Phosphatase: 75 U/L (ref 39–117)
BUN: 9 mg/dL (ref 6–23)
CO2: 25 mEq/L (ref 19–32)
Calcium: 9.7 mg/dL (ref 8.4–10.5)
Chloride: 103 mEq/L (ref 96–112)
Creatinine, Ser: 1.08 mg/dL (ref 0.40–1.20)
GFR: 59.72 mL/min — ABNORMAL LOW (ref >60.00)
Glucose, Bld: 86 mg/dL (ref 70–99)
Potassium: 3.7 mEq/L (ref 3.5–5.1)
Sodium: 138 mEq/L (ref 135–145)
Total Bilirubin: 0.5 mg/dL (ref 0.2–1.2)
Total Protein: 7.1 g/dL (ref 6.0–8.3)

## 2021-07-21 LAB — LIPID PANEL
Cholesterol: 192 mg/dL (ref 0–200)
HDL: 42.1 mg/dL (ref >39.00)
NonHDL: 150.13
Total CHOL/HDL Ratio: 5
Triglycerides: 279 mg/dL — ABNORMAL HIGH (ref 0.0–149.0)
VLDL: 55.8 mg/dL — ABNORMAL HIGH (ref 0.0–40.0)

## 2021-07-21 LAB — CBC WITH DIFFERENTIAL/PLATELET
Basophils Absolute: 0.1 10*3/uL (ref 0.0–0.1)
Basophils Relative: 0.8 % (ref 0.0–3.0)
Eosinophils Absolute: 0 10*3/uL (ref 0.0–0.7)
Eosinophils Relative: 0.1 % (ref 0.0–5.0)
HCT: 40.2 % (ref 36.0–46.0)
Hemoglobin: 13.6 g/dL (ref 12.0–15.0)
Lymphocytes Relative: 19.9 % (ref 12.0–46.0)
Lymphs Abs: 2.6 10*3/uL (ref 0.7–4.0)
MCHC: 33.8 g/dL (ref 30.0–36.0)
MCV: 87.8 fl (ref 78.0–100.0)
Monocytes Absolute: 0.7 10*3/uL (ref 0.1–1.0)
Monocytes Relative: 5.3 % (ref 3.0–12.0)
Neutro Abs: 9.7 10*3/uL — ABNORMAL HIGH (ref 1.4–7.7)
Neutrophils Relative %: 73.9 % (ref 43.0–77.0)
Platelets: 312 10*3/uL (ref 150.0–400.0)
RBC: 4.57 Mil/uL (ref 3.87–5.11)
RDW: 14.8 % (ref 11.5–15.5)
WBC: 13.2 10*3/uL — ABNORMAL HIGH (ref 4.0–10.5)

## 2021-07-21 LAB — HEMOGLOBIN A1C: Hgb A1c MFr Bld: 4.8 % (ref 4.6–6.5)

## 2021-07-21 LAB — TSH: TSH: 0.77 u[IU]/mL (ref 0.35–5.50)

## 2021-07-21 LAB — LDL CHOLESTEROL, DIRECT: Direct LDL: 116 mg/dL

## 2021-08-18 ENCOUNTER — Ambulatory Visit (INDEPENDENT_AMBULATORY_CARE_PROVIDER_SITE_OTHER): Payer: BC Managed Care – PPO | Admitting: Emergency Medicine

## 2021-08-18 ENCOUNTER — Encounter: Payer: Self-pay | Admitting: Emergency Medicine

## 2021-08-18 ENCOUNTER — Other Ambulatory Visit: Payer: Self-pay

## 2021-08-18 VITALS — BP 118/70 | HR 66 | Temp 98.4°F | Ht 62.0 in | Wt 159.0 lb

## 2021-08-18 DIAGNOSIS — N951 Menopausal and female climacteric states: Secondary | ICD-10-CM

## 2021-08-18 DIAGNOSIS — F32A Depression, unspecified: Secondary | ICD-10-CM | POA: Diagnosis not present

## 2021-08-18 DIAGNOSIS — G43909 Migraine, unspecified, not intractable, without status migrainosus: Secondary | ICD-10-CM | POA: Diagnosis not present

## 2021-08-18 DIAGNOSIS — G4700 Insomnia, unspecified: Secondary | ICD-10-CM

## 2021-08-18 DIAGNOSIS — E039 Hypothyroidism, unspecified: Secondary | ICD-10-CM | POA: Diagnosis not present

## 2021-08-18 MED ORDER — ZOLPIDEM TARTRATE 5 MG PO TABS
5.0000 mg | ORAL_TABLET | Freq: Every evening | ORAL | 1 refills | Status: AC | PRN
Start: 2021-08-18 — End: ?

## 2021-08-18 MED ORDER — UBRELVY 100 MG PO TABS: ORAL_TABLET | ORAL | 3 refills | Status: AC

## 2021-08-18 MED ORDER — LEVOTHYROXINE SODIUM 50 MCG PO TABS: 50.0000 ug | ORAL_TABLET | Freq: Every morning | ORAL | 3 refills | Status: AC

## 2021-08-18 NOTE — Assessment & Plan Note (Signed)
Has appointment with gynecologist next month.

## 2021-08-18 NOTE — Patient Instructions (Signed)
Cefalea migraosa Migraine Headache Una cefalea migraosa es un dolor muy intenso y punzante en uno o ambos lados de la cabeza. Este tipo de dolor de cabeza tambin puede causar otros sntomas. Puede durar desde 4 horas hasta 3 das. Hable con su mdico sobre las cosas que pueden causar (desencadenar) esta afeccin. Cules son las causas? Se desconoce la causa exacta de esta afeccin. Esta afeccin puede desencadenarse o ser causada por lo siguiente: Consumo de alcohol. Consumo de cigarrillos. Tomar medicamentos como por ejemplo: Medicamentos para aliviar el dolor torcico (nitroglicerina). Anticonceptivos orales. Estrgeno. Algunos medicamentos para la presin arterial. Comer o beber ciertos productos. Hacer actividad fsica. Otros factores que pueden provocar cefalea migraosa son los siguientes: Tener el perodo menstrual. Embarazo. Hambre. Estrs. No dormir lo suficiente o dormir demasiado. Cambios climticos. Cansancio (fatiga). Qu incrementa el riesgo? Tener entre 25 y 55 aos de edad. Ser mujer. Tener antecedentes familiares de cefalea migraosa. Ser de raza caucsica. Tener depresin o ansiedad. Tener mucho sobrepeso. Cules son los signos o los sntomas? Un dolor punzante. Este dolor puede tener las siguientes caractersticas: Puede aparecer en cualquier regin de la cabeza, tanto de un lado como de ambos. Puede dificultar las actividades cotidianas. Puede empeorar con la actividad fsica. Puede empeorar con las luces brillantes o los ruidos fuertes. Otros sntomas pueden incluir: Ganas de vomitar (nuseas). Vmitos. Mareos. Sensibilidad a las luces brillantes, los ruidos fuertes o los olores. Antes de tener una cefalea migraosa, puede recibir seales de advertencia (aura). Un aura puede incluir: Ver luces intermitentes o tener puntos ciegos. Ver puntos brillantes, halos o lneas en zigzag. Tener una visin en tnel o visin borrosa. Sentir entumecimiento u  hormigueo. Tener dificultad para hablar. Tener msculos dbiles. Algunas personas tienen sntomas despus de una cefalea migraosa (fase posdromal), como los siguientes: Cansancio. Dificultad para pensar (concentrarse). Cmo se trata? Tomar medicamentos para: Aliviar el dolor. Aliviar la sensacin de malestar estomacal. Prevenir las cefaleas migraosas. El tratamiento tambin puede incluir lo siguiente: Tomar sesiones de acupuntura. Evitar los alimentos que provocan las cefaleas migraosas. Aprender maneras de controlar las funciones corporales (biorretroalimentacin). Terapia para ayudarlo a conocer y lidiar con los pensamientos negativos (terapia cognitivo conductual). Siga estas instrucciones en su casa: Medicamentos [place] los medicamentos de venta libre y los recetados solamente como se lo haya indicado el mdico. Consulte a su mdico si el medicamento que le recetaron: Hace que sea necesario que evite conducir o usar maquinaria pesada. Puede causarle dificultad para defecar (estreimiento). Es posible que deba tomar estas medidas para prevenir o tratar los problemas para defecar: Beber suficiente lquido para mantener el pis (la orina) de color amarillo plido. Tomar medicamentos recetados o de venta libre. Comer alimentos ricos en fibra. Entre ellos, frijoles, cereales integrales y frutas y verduras frescas. Limitar los alimentos con alto contenido de grasa y azcar. Estos incluyen alimentos fritos o dulces. Estilo de vida No beba alcohol. No consuma ningn producto que contenga nicotina o tabaco, como cigarrillos, cigarrillos electrnicos y tabaco de mascar. Si necesita ayuda para dejar de fumar, consulte al mdico. Duerma como mnimo 8 horas todas las noches. Limite el estrs y manjelo. Indicaciones generales   Lleve un registro diario para averiguar lo que puede provocar las cefaleas migraosas. Registre, por ejemplo, lo siguiente: Lo que usted come y bebe. El tiempo que  duerme. Algn cambio en lo que come o bebe. Algn cambio en sus medicamentos. Si tiene una cefalea migraosa: Evite los factores que empeoren los sntomas, como las luces brillantes. Resulta   til acostarse en una habitacin oscura y silenciosa. No conduzca vehculos ni opere maquinaria pesada. Pregntele al mdico qu actividades son seguras para usted. Concurra a todas las visitas de seguimiento como se lo haya indicado el mdico. Esto es importante. Comunquese con un mdico si: Tiene una cefalea migraosa que es diferente o peor que otras que ha tenido. Tiene ms de 15 das de cefalea por mes. Solicite ayuda inmediatamente si: La cefalea migraosa empeora mucho. La cefalea migraosa dura ms de 72 horas. Tiene fiebre. Presenta rigidez en el cuello. Tiene dificultad para ver. Siente debilidad en los msculos o que no puede controlarlos. Comienza a perder el equilibrio continuamente. Comienza a tener dificultad para caminar. Pierde el conocimiento (se desmaya). Tiene una convulsin. Resumen Una cefalea migraosa es un dolor muy intenso y punzante en uno o ambos lados de la cabeza. Estos dolores de cabeza tambin pueden causar otros sntomas. Esta afeccin puede tratarse con medicamentos y cambios en el estilo de vida. Lleve un registro diario para averiguar lo que puede provocar las cefaleas migraosas. Comunquese con un mdico si tiene una cefalea migraosa que es diferente o peor que otras que ha tenido. Comunquese con el mdico si tiene ms de 15 das de cefalea en un mes. Esta informacin no tiene como fin reemplazar el consejo del mdico. Asegrese de hacerle al mdico cualquier pregunta que tenga. Document Revised: 02/16/2019 Document Reviewed: 02/16/2019 Elsevier Patient Education  2022 Elsevier Inc.  

## 2021-08-18 NOTE — Progress Notes (Signed)
Lindsey Austin 50 y.o.   Chief Complaint  Patient presents with   Follow-up    Anxiety. Pt states she stopped taking bupropion, due to dizziness and headaches. Pt states she is not better.    HISTORY OF PRESENT ILLNESS: This is a 50 y.o. female seen by me 07/20/2021 to establish care.  The following problems were identified: Hypothyroidism Clinically euthyroid.  Continue Synthroid 50 mcg daily Blood work done today. Needs refill on her medication.  Lab Results  Component Value Date   TSH 0.77 07/20/2021    General weakness Multifactorial.  Differential diagnosis discussed with patient. Blood work done today. Blood work showed mildly decreased GFR and increased triglycerides.  Otherwise unremarkable with acceptable values.     Chronic depression Chronic depression and anxiety since age 41. Needs evaluation by psychiatrist.  Has failed multiple medications in the past as per patient. Will try Wellbutrin 150 mg twice a day.  It may help with her libido as well. Developed side effects to Wellbutrin so she stopped taking it. Today requesting something to help her sleep. Referral center called her today to arrange for psychiatric follow-up.  Patient has phone number to call back and schedule appointment.   Perimenopausal Multiple symptoms including decreased libido affecting quality of life. Needs GYN evaluation. She was contacted and appointment was made with GYN doctor for next month.  HPI   Prior to Admission medications   Medication Sig Start Date End Date Taking? Authorizing Provider  buPROPion (WELLBUTRIN SR) 150 MG 12 hr tablet Take 1 tablet (150 mg total) by mouth 2 (two) times daily. 07/20/21 10/18/21 Yes Lindsey Austin, Lindsey Kempf, MD  Hyoscyamine Sulfate SL 0.125 MG SUBL Take by mouth. 02/23/19  Yes [provider]  ibuprofen (ADVIL,MOTRIN) 600 MG tablet Take 1 tablet (600 mg total) by mouth every 6 (six) hours as needed. 05/07/18  Yes Lindsey Lodge, PA-C  meloxicam  (MOBIC) 7.5 MG tablet TOME UNA TABLETA TODOS LOS DIAS 01/02/20  Yes [provider]  Ubrogepant (UBRELVY) 100 MG TABS Sig 100 mg at onset of headache. May repeat in 2 hours. Daily maximum 200 mg. 08/18/21  Yes Lindsey Austin, Lindsey Kempf, MD  albuterol (PROVENTIL HFA;VENTOLIN HFA) 108 (90 BASE) MCG/ACT inhaler Inhale 2 puffs into the lungs every 4 (four) hours as needed for wheezing or shortness of breath (or coughing). Patient not taking: No sig reported 03/14/13   Lindsey Booze, MD  levothyroxine (SYNTHROID) 50 MCG tablet Take 1 tablet (50 mcg total) by mouth every morning. 08/18/21 11/16/21  Lindsey Quint, MD    Allergies  Allergen Reactions   Acetaminophen Hives   Meloxicam     Other reaction(s): GI Upset (intolerance)   Sumatriptan     Other reaction(s): Fever (intolerance), GI Upset (intolerance)    Patient Active Problem List   Diagnosis Date Noted   Hypothyroidism 07/20/2021   General weakness 07/20/2021   Chronic depression 07/20/2021   Perimenopausal 07/20/2021    Past Medical History:  Diagnosis Date   Migraine    Rheumatoid arthritis (HCC)    Thyroid disease     No past surgical history on file.  Social History   Socioeconomic History   Marital status: Legally Separated    Spouse name: Not on file   Number of children: Not on file   Years of education: Not on file   Highest education level: Not on file  Occupational History   Not on file  Tobacco Use   Smoking status: Never  Smokeless tobacco: Never  Vaping Use   Vaping Use: Never used  Substance and Sexual Activity   Alcohol use: No   Drug use: Never   Sexual activity: Not on file  Other Topics Concern   Not on file  Social History Narrative   Not on file   Social Determinants of Health   Financial Resource Strain: Not on file  Food Insecurity: Not on file  Transportation Needs: Not on file  Physical Activity: Not on file  Stress: Not on file  Social Connections: Not on file   Intimate Partner Violence: Not on file    No family history on file.   Review of Systems  Constitutional: Negative.  Negative for chills and fever.  HENT: Negative.  Negative for congestion and sore throat.   Respiratory: Negative.  Negative for cough and shortness of breath.   Cardiovascular:  Negative for chest pain and palpitations.  Genitourinary: Negative.  Negative for dysuria.  Musculoskeletal: Negative.   Skin: Negative.  Negative for rash.  Neurological:  Positive for headaches. Negative for dizziness.  Psychiatric/Behavioral:  The patient is nervous/anxious.   All other systems reviewed and are negative. Today's Vitals   08/18/21 1541  BP: 118/70  Pulse: 66  Temp: 98.4 F (36.9 C)  TempSrc: Rectal  SpO2: 98%  Weight: 159 lb (72.1 kg)  Height: 5\' 2"  (1.575 m)   Body mass index is 29.08 kg/m.   Physical Exam Vitals reviewed.  Constitutional:      Appearance: Normal appearance.  HENT:     Head: Normocephalic.  Eyes:     Extraocular Movements: Extraocular movements intact.     Pupils: Pupils are equal, round, and reactive to light.  Cardiovascular:     Rate and Rhythm: Normal rate and regular rhythm.     Pulses: Normal pulses.     Heart sounds: Normal heart sounds.  Pulmonary:     Effort: Pulmonary effort is normal.     Breath sounds: Normal breath sounds.  Musculoskeletal:     Cervical back: Normal range of motion and neck supple.  Skin:    General: Skin is warm and dry.  Neurological:     General: No focal deficit present.     Mental Status: She is alert and oriented to person, place, and time.  Psychiatric:        Mood and Affect: Mood normal.        Behavior: Behavior normal.     ASSESSMENT & PLAN: Migraine without status migrainosus, not intractable Intractable and affecting quality of life.  We will try Ubrelvy.  Hypothyroidism Clinically euthyroid.  Continue Synthroid 50 mcg daily.  Chronic depression Discontinue Wellbutrin due to  side effects.  Needs to follow-up with behavioral health for further evaluation and treatment.  Insomnia Lack of sleep affecting quality of life.  We will try Ambien 5 mg at bedtime.  Perimenopausal Has appointment with gynecologist next month. Lindsey Austin was seen today for follow-up.  Diagnoses and all orders for this visit:  Migraine without status migrainosus, not intractable, unspecified migraine type -     Ubrogepant (UBRELVY) 100 MG TABS; Sig 100 mg at onset of headache. May repeat in 2 hours. Daily maximum 200 mg.  Hypothyroidism, unspecified type -     levothyroxine (SYNTHROID) 50 MCG tablet; Take 1 tablet (50 mcg total) by mouth every morning.  Insomnia, unspecified type  Chronic depression  Perimenopausal  Other orders -     zolpidem (AMBIEN) 5 MG tablet; Take 1 tablet (  5 mg total) by mouth at bedtime as needed for sleep.  Patient Instructions  Cefalea migraosa Migraine Headache Una cefalea migraosa es un dolor muy intenso y punzante en uno o ambos lados de la cabeza. Este tipo de dolor de cabeza tambin puede causar otros sntomas. Puede durar desde 4 horas [employer]. Hable con su mdico sobre las cosas que pueden causar (desencadenar) esta afeccin. Cules son las causas? Se desconoce la causa exacta de esta afeccin. Esta afeccin puede desencadenarse o ser causada por lo siguiente: Consumo de alcohol. Consumo de cigarrillos. Tomar medicamentos como por ejemplo: Medicamentos para Gaffer (nitroglicerina). Anticonceptivos orales. Estrgeno. Algunos medicamentos para la presin arterial. Comer o beber ciertos productos. Hacer actividad fsica. Otros factores que pueden provocar cefalea migraosa son los siguientes: Tener el perodo menstrual. Vanetta Mulders. Hambre. Estrs. No dormir lo suficiente o dormir demasiado. Cambios climticos. Cansancio (fatiga). Qu incrementa el riesgo? Tener entre 25 y 54 aos de edad. Ser mujer. Tener  antecedentes familiares de cefalea migraosa. Ser de Engineer, manufacturing. Tener depresin o ansiedad. Tener mucho sobrepeso. Cules son los signos o los sntomas? Un dolor punzante. Este dolor puede [employer] siguientes caractersticas: Puede aparecer en cualquier regin de la cabeza, tanto de un lado como de [place]. Puede dificultar las actividades cotidianas. Puede empeorar con la actividad fsica. Puede empeorar con las luces brillantes o los ruidos fuertes. Otros sntomas pueden incluir: Ganas de vomitar (nuseas). Vmitos. Mareos. Sensibilidad a las [employer], los ruidos fuertes o [employer]. Antes de tener una cefalea migraosa, puede recibir seales de advertencia (aura). Un aura puede incluir: Ver luces intermitentes o tener puntos ciegos. Ver puntos brillantes, halos o lneas en zigzag. Tener una visin en tnel o visin borrosa. Sentir entumecimiento u hormigueo. Tener dificultad para hablar. Tener msculos dbiles. Algunas personas tienen sntomas despus de una cefalea migraosa (fase posdromal), como los siguientes: Cansancio. Dificultad para pensar (concentrarse). Cmo se trata? Tomar medicamentos para: Engineer, materials. Aliviar la sensacin de Programme researcher, broadcasting/film/video. Prevenir las cefaleas migraosas. El tratamiento tambin puede incluir lo siguiente: Tomar sesiones de acupuntura. Evitar los alimentos que provocan las cefaleas migraosas. Aprender Duaine Dredge de controlar las funciones corporales (biorretroalimentacin). Terapia para ayudarlo a Solicitor y Financial trader con los pensamientos negativos (terapia cognitivo conductual). Siga estas instrucciones en su casa: Medicamentos [employer] de venta libre y los recetados solamente como se lo haya indicado el mdico. Consulte a su mdico si el medicamento que le recetaron: Hace que sea necesario que evite conducir o usar maquinaria pesada. Puede causarle dificultad para defecar (estreimiento). Es posible que deba  tomar estas medidas para prevenir o tratar los problemas para defecar: Product manager suficiente lquido para Radio producer pis (la orina) de color amarillo plido. Tomar medicamentos recetados o de [employer]. [place] alimentos ricos en fibra. Entre ellos, frijoles, cereales integrales y frutas y verduras frescas. Limitar los alimentos con alto contenido de grasa y International aid/development worker. Estos incluyen alimentos fritos o dulces. Estilo de vida No beba alcohol. No consuma ningn producto que contenga nicotina o tabaco, como cigarrillos, cigarrillos electrnicos y tabaco de Theatre manager. Si necesita ayuda para dejar de fumar, consulte al mdico. Duerma como mnimo 8 horas todas las noches. Limite el estrs y [place]. Indicaciones generales     Lleve un registro diario para Financial risk analyst lo que Advice worker. Registre, por ejemplo, lo siguiente: Lo que usted come y bebe. El tiempo que duerme. Algn cambio en lo que come o bebe. Algn cambio en sus medicamentos.  Si tiene una cefalea migraosa: Evite los factores que [employer] sntomas, como las luces brillantes. Resulta til acostarse en una habitacin oscura y silenciosa. No conduzca vehculos ni opere maquinaria pesada. Pregntele al mdico qu actividades son seguras para usted. Concurra a todas las visitas de [address] se lo haya indicado el mdico. Esto es importante. Comunquese con un mdico si: Tiene una cefalea migraosa que es diferente o peor que otras que ha tenido. Tiene ms de [address] de cefalea por mes. Solicite ayuda inmediatamente si: La cefalea migraosa [employer]. La cefalea migraosa dura ms de 72 horas. Tiene fiebre. Presenta rigidez en el cuello. Tiene dificultad para ver. Siente debilidad en los msculos o que no puede controlarlos. Comienza a perder el equilibrio continuamente. Comienza a tener dificultad para caminar. Pierde el conocimiento (se desmaya). Tiene una convulsin. Resumen [place] cefalea  migraosa es un dolor muy intenso y punzante en uno o ambos lados de la cabeza. Estos dolores de [place] tambin pueden causar otros sntomas. Esta afeccin puede tratarse con medicamentos y cambios en el estilo de vida. Lleve un registro diario para Financial risk analyst lo que Advice worker. Comunquese con un mdico si tiene una cefalea migraosa que es diferente o peor que otras que ha tenido. Comunquese con el mdico si tiene ms de 15 das de cefalea en un mes. Esta informacin no tiene Theme park manager el consejo del mdico. Asegresede hacerle al mdico cualquier pregunta que tenga. Document Revised: 02/16/2019 Document Reviewed: 02/16/2019 Elsevier Patient Education  2022 Elsevier Inc.    Edwina Barth, MD [facility]

## 2021-08-18 NOTE — Assessment & Plan Note (Signed)
Intractable and affecting quality of life.  We will try Ubrelvy.

## 2021-08-18 NOTE — Assessment & Plan Note (Signed)
Discontinue Wellbutrin due to side effects.  Needs to follow-up with behavioral health for further evaluation and treatment.

## 2021-08-18 NOTE — Assessment & Plan Note (Signed)
Lack of sleep affecting quality of life.  We will try Ambien 5 mg at bedtime.

## 2021-08-18 NOTE — Assessment & Plan Note (Signed)
Clinically euthyroid. Continue Synthroid 50 mcg daily. 

## 2021-09-24 ENCOUNTER — Telehealth: Payer: Self-pay

## 2021-09-24 NOTE — Telephone Encounter (Signed)
PA started for Lindsey Austin   (Key: [id number])

## 2021-10-20 DIAGNOSIS — N951 Menopausal and female climacteric states: Secondary | ICD-10-CM | POA: Diagnosis not present

## 2021-10-20 DIAGNOSIS — N950 Postmenopausal bleeding: Secondary | ICD-10-CM | POA: Diagnosis not present

## 2021-10-20 DIAGNOSIS — Z124 Encounter for screening for malignant neoplasm of cervix: Secondary | ICD-10-CM | POA: Diagnosis not present

## 2021-10-20 DIAGNOSIS — Z01411 Encounter for gynecological examination (general) (routine) with abnormal findings: Secondary | ICD-10-CM | POA: Diagnosis not present

## 2021-10-21 DIAGNOSIS — N951 Menopausal and female climacteric states: Secondary | ICD-10-CM | POA: Diagnosis not present

## 2022-04-27 DIAGNOSIS — M2550 Pain in unspecified joint: Secondary | ICD-10-CM | POA: Diagnosis not present

## 2022-04-27 DIAGNOSIS — G894 Chronic pain syndrome: Secondary | ICD-10-CM | POA: Diagnosis not present

## 2022-04-27 DIAGNOSIS — M069 Rheumatoid arthritis, unspecified: Secondary | ICD-10-CM | POA: Diagnosis not present

## 2022-04-27 DIAGNOSIS — E042 Nontoxic multinodular goiter: Secondary | ICD-10-CM | POA: Diagnosis not present

## 2022-04-27 DIAGNOSIS — E039 Hypothyroidism, unspecified: Secondary | ICD-10-CM | POA: Diagnosis not present

## 2022-05-25 DIAGNOSIS — G4700 Insomnia, unspecified: Secondary | ICD-10-CM | POA: Diagnosis not present

## 2022-05-25 DIAGNOSIS — Z6829 Body mass index (BMI) 29.0-29.9, adult: Secondary | ICD-10-CM | POA: Diagnosis not present

## 2022-05-25 DIAGNOSIS — G44229 Chronic tension-type headache, not intractable: Secondary | ICD-10-CM | POA: Diagnosis not present

## 2022-05-25 DIAGNOSIS — M0579 Rheumatoid arthritis with rheumatoid factor of multiple sites without organ or systems involvement: Secondary | ICD-10-CM | POA: Diagnosis not present

## 2022-07-12 DIAGNOSIS — M25552 Pain in left hip: Secondary | ICD-10-CM | POA: Diagnosis not present

## 2022-07-12 DIAGNOSIS — M545 Low back pain, unspecified: Secondary | ICD-10-CM | POA: Diagnosis not present

## 2022-07-12 DIAGNOSIS — G8929 Other chronic pain: Secondary | ICD-10-CM | POA: Diagnosis not present

## 2022-07-12 DIAGNOSIS — M5442 Lumbago with sciatica, left side: Secondary | ICD-10-CM | POA: Diagnosis not present

## 2022-07-12 DIAGNOSIS — R768 Other specified abnormal immunological findings in serum: Secondary | ICD-10-CM | POA: Diagnosis not present

## 2022-07-12 DIAGNOSIS — M25551 Pain in right hip: Secondary | ICD-10-CM | POA: Diagnosis not present

## 2022-07-12 DIAGNOSIS — Z789 Other specified health status: Secondary | ICD-10-CM | POA: Diagnosis not present

## 2022-07-12 DIAGNOSIS — M5441 Lumbago with sciatica, right side: Secondary | ICD-10-CM | POA: Diagnosis not present

## 2022-08-13 DIAGNOSIS — G43109 Migraine with aura, not intractable, without status migrainosus: Secondary | ICD-10-CM | POA: Diagnosis not present

## 2022-08-13 DIAGNOSIS — E042 Nontoxic multinodular goiter: Secondary | ICD-10-CM | POA: Diagnosis not present

## 2022-08-13 DIAGNOSIS — Z1231 Encounter for screening mammogram for malignant neoplasm of breast: Secondary | ICD-10-CM | POA: Diagnosis not present

## 2022-08-13 DIAGNOSIS — E039 Hypothyroidism, unspecified: Secondary | ICD-10-CM | POA: Diagnosis not present

## 2022-08-18 DIAGNOSIS — E041 Nontoxic single thyroid nodule: Secondary | ICD-10-CM | POA: Diagnosis not present

## 2022-09-10 DIAGNOSIS — G43109 Migraine with aura, not intractable, without status migrainosus: Secondary | ICD-10-CM | POA: Diagnosis not present

## 2022-09-10 DIAGNOSIS — Z2821 Immunization not carried out because of patient refusal: Secondary | ICD-10-CM | POA: Diagnosis not present

## 2022-09-13 ENCOUNTER — Other Ambulatory Visit: Payer: Self-pay | Admitting: Emergency Medicine

## 2022-09-13 DIAGNOSIS — E039 Hypothyroidism, unspecified: Secondary | ICD-10-CM

## 2022-09-17 DIAGNOSIS — E041 Nontoxic single thyroid nodule: Secondary | ICD-10-CM | POA: Diagnosis not present

## 2022-09-17 DIAGNOSIS — E042 Nontoxic multinodular goiter: Secondary | ICD-10-CM | POA: Diagnosis not present

## 2022-09-17 DIAGNOSIS — R946 Abnormal results of thyroid function studies: Secondary | ICD-10-CM | POA: Diagnosis not present

## 2022-09-29 DIAGNOSIS — E039 Hypothyroidism, unspecified: Secondary | ICD-10-CM | POA: Diagnosis not present

## 2022-09-29 DIAGNOSIS — E042 Nontoxic multinodular goiter: Secondary | ICD-10-CM | POA: Diagnosis not present

## 2022-10-04 ENCOUNTER — Ambulatory Visit: Payer: BC Managed Care – PPO | Admitting: Emergency Medicine

## 2022-10-05 DIAGNOSIS — M2550 Pain in unspecified joint: Secondary | ICD-10-CM | POA: Diagnosis not present

## 2022-10-05 DIAGNOSIS — R768 Other specified abnormal immunological findings in serum: Secondary | ICD-10-CM | POA: Diagnosis not present

## 2022-10-05 DIAGNOSIS — Z789 Other specified health status: Secondary | ICD-10-CM | POA: Diagnosis not present

## 2022-10-06 DIAGNOSIS — E042 Nontoxic multinodular goiter: Secondary | ICD-10-CM | POA: Diagnosis not present

## 2022-10-06 DIAGNOSIS — E041 Nontoxic single thyroid nodule: Secondary | ICD-10-CM | POA: Diagnosis not present

## 2022-10-06 DIAGNOSIS — E0590 Thyrotoxicosis, unspecified without thyrotoxic crisis or storm: Secondary | ICD-10-CM | POA: Diagnosis not present

## 2023-01-26 DIAGNOSIS — E039 Hypothyroidism, unspecified: Secondary | ICD-10-CM | POA: Diagnosis not present

## 2023-01-26 DIAGNOSIS — E042 Nontoxic multinodular goiter: Secondary | ICD-10-CM | POA: Diagnosis not present

## 2023-07-13 DIAGNOSIS — R1319 Other dysphagia: Secondary | ICD-10-CM | POA: Diagnosis not present

## 2023-07-13 DIAGNOSIS — R1013 Epigastric pain: Secondary | ICD-10-CM | POA: Diagnosis not present

## 2023-07-13 DIAGNOSIS — E039 Hypothyroidism, unspecified: Secondary | ICD-10-CM | POA: Diagnosis not present

## 2023-07-13 DIAGNOSIS — K219 Gastro-esophageal reflux disease without esophagitis: Secondary | ICD-10-CM | POA: Diagnosis not present

## 2023-07-25 DIAGNOSIS — M25542 Pain in joints of left hand: Secondary | ICD-10-CM | POA: Diagnosis not present

## 2023-07-25 DIAGNOSIS — M25541 Pain in joints of right hand: Secondary | ICD-10-CM | POA: Diagnosis not present

## 2023-07-25 DIAGNOSIS — M2550 Pain in unspecified joint: Secondary | ICD-10-CM | POA: Diagnosis not present

## 2023-07-25 DIAGNOSIS — R768 Other specified abnormal immunological findings in serum: Secondary | ICD-10-CM | POA: Diagnosis not present

## 2023-07-25 DIAGNOSIS — Z789 Other specified health status: Secondary | ICD-10-CM | POA: Diagnosis not present

## 2023-08-22 ENCOUNTER — Emergency Department (HOSPITAL_BASED_OUTPATIENT_CLINIC_OR_DEPARTMENT_OTHER): Payer: BC Managed Care – PPO

## 2023-08-22 ENCOUNTER — Encounter (HOSPITAL_BASED_OUTPATIENT_CLINIC_OR_DEPARTMENT_OTHER): Payer: Self-pay

## 2023-08-22 ENCOUNTER — Emergency Department (HOSPITAL_BASED_OUTPATIENT_CLINIC_OR_DEPARTMENT_OTHER)
Admission: EM | Admit: 2023-08-22 | Discharge: 2023-08-22 | Disposition: A | Discharge status: Home / Self Care | Payer: BC Managed Care – PPO | Attending: Emergency Medicine | Admitting: Emergency Medicine

## 2023-08-22 ENCOUNTER — Other Ambulatory Visit: Payer: Self-pay

## 2023-08-22 DIAGNOSIS — E876 Hypokalemia: Secondary | ICD-10-CM | POA: Diagnosis not present

## 2023-08-22 DIAGNOSIS — R0789 Other chest pain: Secondary | ICD-10-CM | POA: Diagnosis not present

## 2023-08-22 DIAGNOSIS — I700 Atherosclerosis of aorta: Secondary | ICD-10-CM | POA: Diagnosis not present

## 2023-08-22 DIAGNOSIS — Z79899 Other long term (current) drug therapy: Secondary | ICD-10-CM | POA: Insufficient documentation

## 2023-08-22 DIAGNOSIS — R519 Headache, unspecified: Secondary | ICD-10-CM | POA: Diagnosis not present

## 2023-08-22 DIAGNOSIS — G43809 Other migraine, not intractable, without status migrainosus: Secondary | ICD-10-CM | POA: Diagnosis not present

## 2023-08-22 DIAGNOSIS — E039 Hypothyroidism, unspecified: Secondary | ICD-10-CM | POA: Diagnosis not present

## 2023-08-22 DIAGNOSIS — R079 Chest pain, unspecified: Secondary | ICD-10-CM | POA: Diagnosis not present

## 2023-08-22 DIAGNOSIS — Z0389 Encounter for observation for other suspected diseases and conditions ruled out: Secondary | ICD-10-CM | POA: Diagnosis not present

## 2023-08-22 LAB — BASIC METABOLIC PANEL
Anion gap: 8 (ref 5–15)
BUN: 9 mg/dL (ref 6–20)
CO2: 27 mmol/L (ref 22–32)
Calcium: 9.2 mg/dL (ref 8.9–10.3)
Chloride: 103 mmol/L (ref 98–111)
Creatinine, Ser: 0.67 mg/dL (ref 0.44–1.00)
GFR, Estimated: >60 mL/min (ref >60)
Glucose, Bld: 95 mg/dL (ref 70–99)
Potassium: 3.2 mmol/L — ABNORMAL LOW (ref 3.5–5.1)
Sodium: 138 mmol/L (ref 135–145)

## 2023-08-22 LAB — CBC
HCT: 37.8 % (ref 36.0–46.0)
Hemoglobin: 13.2 g/dL (ref 12.0–15.0)
MCH: 28.8 pg (ref 26.0–34.0)
MCHC: 34.9 g/dL (ref 30.0–36.0)
MCV: 82.4 fL (ref 80.0–100.0)
Platelets: 224 10*3/uL (ref 150–400)
RBC: 4.59 MIL/uL (ref 3.87–5.11)
RDW: 13.1 % (ref 11.5–15.5)
WBC: 6.3 10*3/uL (ref 4.0–10.5)
nRBC: 0 % (ref 0.0–0.2)

## 2023-08-22 LAB — TROPONIN I (HIGH SENSITIVITY)
Troponin I (High Sensitivity): 5 ng/L (ref <18)
Troponin I (High Sensitivity): 4 ng/L (ref <18)

## 2023-08-22 MED ORDER — IOHEXOL 350 MG/ML SOLN
75.0000 mL | Freq: Once | INTRAVENOUS | Status: AC | PRN
  Administered 2023-08-22: 75 mL via INTRAVENOUS

## 2023-08-22 MED ORDER — METOCLOPRAMIDE HCL 5 MG/ML IJ SOLN
10.0000 mg | Freq: Once | INTRAMUSCULAR | Status: AC
  Administered 2023-08-22: 10 mg via INTRAVENOUS
  Filled 2023-08-22: qty 2

## 2023-08-22 MED ORDER — POTASSIUM CHLORIDE 20 MEQ PO PACK
40.0000 meq | PACK | Freq: Once | ORAL | Status: AC
  Administered 2023-08-22: 40 meq via ORAL
  Filled 2023-08-22: qty 2

## 2023-08-22 MED ORDER — PROCHLORPERAZINE EDISYLATE 10 MG/2ML IJ SOLN
5.0000 mg | Freq: Once | INTRAMUSCULAR | Status: AC
  Administered 2023-08-22: 5 mg via INTRAVENOUS
  Filled 2023-08-22: qty 2

## 2023-08-22 MED ORDER — LACTATED RINGERS IV BOLUS
1000.0000 mL | Freq: Once | INTRAVENOUS | Status: AC
  Administered 2023-08-22: 1000 mL via INTRAVENOUS

## 2023-08-22 MED ORDER — KETOROLAC TROMETHAMINE 15 MG/ML IJ SOLN
15.0000 mg | Freq: Once | INTRAMUSCULAR | Status: AC
  Administered 2023-08-22: 15 mg via INTRAVENOUS
  Filled 2023-08-22: qty 1

## 2023-08-22 NOTE — ED Notes (Signed)
Pt did not have any unilateral deficits noted. Full ROM and distal PMS intact. Bilateral hand tingling and "swollen lips" still present.   Family and patient coached on unilateral stroke deficits and told to his call bell if she finds any new symptoms.

## 2023-08-22 NOTE — Discharge Instructions (Signed)
Your workup today was notable only for slightly low potassium.  I recommend you follow-up with your primary care provider.  If you develop severe headache, weakness, numbness, chest pain, difficulty breathing or any other new concerning symptoms you should return to the ED.

## 2023-08-22 NOTE — ED Notes (Signed)
Pt returned from CT, family assisted her to the restroom without incident.

## 2023-08-22 NOTE — ED Triage Notes (Addendum)
Pt reports chest pain that began last night . Started when she was walking and got worse during night. Pain left sided into throat and neck. HA . Reports nausea and dizziness with movement of head. Tingly and numbness left side of face and bilateral hands and feet. Worse left side.Toes numb on left side pinky numb left hand x 3 weeks. Decreased sensation left face.  Symptoms started approx 6 pm

## 2023-08-22 NOTE — ED Notes (Signed)
Patient transported to CT 

## 2023-08-22 NOTE — ED Provider Notes (Signed)
[place] [facility] Provider Note   CSN: [id number] Arrival date & time: 08/22/23  1617     History  Chief Complaint  Patient presents with   Chest Pain   Numbness    Lindsey Austin is a 52 y.o. female.   Chest Pain 52 year old female history of rheumatoid arthritis, hypothyroidism presenting for multiple concerns.  Patient states for a few days she has had right-sided headache similar to prior migraines although slightly worse.  She feels like it has gotten worse, not sudden onset but is quite severe.  She has had some associated nausea.  She also notes that last night before going to dinner she developed some substernal stabbing chest pain that radiates to her back.  It is intermittent, nonexertional.  No shortness of breath or pleuritic pain.  No abdominal pain.  No fevers or chills.  She also notes that she has had about 2 to 3 weeks of numbness to her left pinky and left toes which is not acutely worsened.  Had some blurry vision earlier that has since resolved.  She has history of migraines but has not had the symptoms before.  No history of ACS or stroke.      Home Medications Prior to Admission medications   Medication Sig Start Date End Date Taking? Authorizing Provider  albuterol (PROVENTIL HFA;VENTOLIN HFA) 108 (90 BASE) MCG/ACT inhaler Inhale 2 puffs into the lungs every 4 (four) hours as needed for wheezing or shortness of breath (or coughing). Patient not taking: No sig reported 03/14/13   Dione Booze, MD  buPROPion [facility] SR) 150 MG 12 hr tablet Take 1 tablet (150 mg total) by mouth 2 (two) times daily. 07/20/21 10/18/21  Georgina Quint, MD  Hyoscyamine Sulfate SL 0.125 MG SUBL Take by mouth. 02/23/19   [provider]  ibuprofen (ADVIL,MOTRIN) 600 MG tablet Take 1 tablet (600 mg total) by mouth every 6 (six) hours as needed. 05/07/18   Dartha Lodge, PA-C  levothyroxine (SYNTHROID) 50 MCG tablet Take 1 tablet (50 mcg  total) by mouth every morning. 08/18/21 11/16/21  Georgina Quint, MD  meloxicam (MOBIC) 7.5 MG tablet TOME UNA TABLETA TODOS LOS DIAS 01/02/20   [provider]  Ubrogepant (UBRELVY) 100 MG TABS Sig 100 mg at onset of headache. May repeat in 2 hours. Daily maximum 200 mg. 08/18/21   Georgina Quint, MD  zolpidem (AMBIEN) 5 MG tablet Take 1 tablet (5 mg total) by mouth at bedtime as needed for sleep. 08/18/21   Georgina Quint, MD      Allergies    Acetaminophen, Meloxicam, and Sumatriptan    Review of Systems   Review of Systems  Cardiovascular:  Positive for chest pain.  Review of systems completed and notable as per HPI.  ROS otherwise negative.   Physical Exam Updated Vital Signs BP (!) 148/88 (BP Location: Right Arm)   Pulse (!) 59   Temp (!) 97.5 F (36.4 C) (Oral)   Resp 18   Ht 5\' 3"  (1.6 m)   Wt 71.7 kg   LMP 06/29/2021 (Approximate)   SpO2 98%   BMI 27.99 kg/m  Physical Exam Vitals and nursing note reviewed.  Constitutional:      General: She is not in acute distress.    Appearance: She is well-developed.  HENT:     Head: Normocephalic and atraumatic.     Nose: Nose normal.     Mouth/Throat:     Mouth:  Mucous membranes are moist.     Pharynx: Oropharynx is clear.  Eyes:     Extraocular Movements: Extraocular movements intact.     Conjunctiva/sclera: Conjunctivae normal.     Pupils: Pupils are equal, round, and reactive to light.  Cardiovascular:     Rate and Rhythm: Normal rate and regular rhythm.     Pulses:          Radial pulses are 2+ on the right side and 2+ on the left side.       Dorsalis pedis pulses are 2+ on the right side and 2+ on the left side.     Heart sounds: No murmur heard. Pulmonary:     Effort: Pulmonary effort is normal. No respiratory distress.     Breath sounds: Normal breath sounds.  Abdominal:     Palpations: Abdomen is soft.     Tenderness: There is no abdominal tenderness.  Musculoskeletal:         General: No swelling.     Cervical back: Normal range of motion and neck supple. No rigidity or tenderness.     Right lower leg: No edema.     Left lower leg: No edema.  Skin:    General: Skin is warm and dry.     Capillary Refill: Capillary refill takes less than 2 seconds.  Neurological:     General: No focal deficit present.     Mental Status: She is alert and oriented to person, place, and time. Mental status is at baseline.     Cranial Nerves: No cranial nerve deficit.     Sensory: No sensory deficit.     Motor: No weakness.     Coordination: Coordination normal.     Gait: Gait normal.     Deep Tendon Reflexes: Reflexes normal.  Psychiatric:        Mood and Affect: Mood normal.     ED Results / Procedures / Treatments   Labs (all labs ordered are listed, but only abnormal results are displayed) Labs Reviewed  BASIC METABOLIC PANEL - Abnormal; Notable for the following components:      Result Value   Potassium 3.2 (*)    All other components within normal limits  CBC  TROPONIN I (HIGH SENSITIVITY)  TROPONIN I (HIGH SENSITIVITY)    EKG EKG Interpretation Date/Time:  Monday August 22 2023 16:34:30 EDT Ventricular Rate:  59 PR Interval:  146 QRS Duration:  93 QT Interval:  461 QTC Calculation: 457 R Axis:   69  Text Interpretation: Sinus rhythm Confirmed by Fulton Reek [phone]) on 08/22/2023 4:40:26 PM  Radiology CT Angio Head Neck W WO CM  Result Date: 08/22/2023 CLINICAL DATA:  Initial evaluation for neuro deficit, stroke suspected. EXAM: CT ANGIOGRAPHY HEAD AND NECK WITH AND WITHOUT CONTRAST TECHNIQUE: Multidetector CT imaging of the head and neck was performed using the standard protocol during bolus administration of intravenous contrast. Multiplanar CT image reconstructions and MIPs were obtained to evaluate the vascular anatomy. Carotid stenosis measurements (when applicable) are obtained utilizing NASCET criteria, using the distal internal carotid diameter  as the denominator. RADIATION DOSE REDUCTION: This exam was performed according to the departmental dose-optimization program which includes automated exposure control, adjustment of the mA and/or kV according to patient size and/or use of iterative reconstruction technique. CONTRAST:  75mL OMNIPAQUE IOHEXOL 350 MG/ML SOLN COMPARISON:  Prior study from 06/09/2004. FINDINGS: CT HEAD FINDINGS Brain: Cerebral volume within normal limits for patient age. No evidence for acute intracranial hemorrhage. No  findings to suggest acute large vessel territory infarct. No mass lesion, midline shift, or mass effect. Ventricles are normal in size without evidence for hydrocephalus. No extra-axial fluid collection identified. Vascular: No hyperdense vessel identified. Skull: Scalp soft tissues demonstrate no acute abnormality. Calvarium intact. Sinuses/Orbits: Globes and orbital soft tissues within normal limits. Visualized paranasal sinuses are clear. No mastoid effusion. CTA NECK FINDINGS Aortic arch: Standard branching. Imaged portion shows no evidence of aneurysm or dissection. No significant stenosis of the major arch vessel origins. Minimal atheromatous change about the aortic arch for age. Right carotid system: No evidence of dissection, stenosis (50% or greater), or occlusion. Left carotid system: No evidence of dissection, stenosis (50% or greater), or occlusion. Vertebral arteries: Both vertebral arteries arise from subclavian arteries. No proximal subclavian artery stenosis. Vertebral arteries patent without stenosis or dissection. Skeleton: No discrete or worrisome osseous lesions. Other neck: No other acute finding. Upper chest: No other acute finding. Review of the MIP images confirms the above findings CTA HEAD FINDINGS Anterior circulation: Normal both internal carotid arteries are patent to the termini without stenosis. A1 segments, anterior chronic complex common anterior cerebral arteries patent without stenosis.  No M1 stenosis or occlusion. No proximal MCA branch occlusion or high-grade stenosis. Distal MCA branches perfused and symmetric. Posterior circulation: Both V4 segments patent without stenosis. Both PICA patent. Basilar patent without stenosis. Superior cerebral arteries patent bilaterally. Left PCA supplied via the basilar. Fetal type origin of the right PCA. Both PCAs patent without stenosis. Venous sinuses: Patent allowing for timing the contrast bolus. Anatomic variants: As above.  No aneurysm. Review of the MIP images confirms the above findings IMPRESSION: 1. Normal CTA of the head and neck. No large vessel occlusion, hemodynamically significant stenosis, or other acute vascular abnormality. 2. No other acute intracranial abnormality. Electronically Signed   By: Rise Mu M.D.   On: 08/22/2023 20:28   CT Angio Chest/Abd/Pel for Dissection W and/or W/WO  Result Date: 08/22/2023 CLINICAL DATA:  Acute aortic syndrome suspected EXAM: CT ANGIOGRAPHY CHEST, ABDOMEN AND PELVIS TECHNIQUE: Non-contrast CT of the chest was initially obtained. Multidetector CT imaging through the chest, abdomen and pelvis was performed using the standard protocol during bolus administration of intravenous contrast. Multiplanar reconstructed images and MIPs were obtained and reviewed to evaluate the vascular anatomy. RADIATION DOSE REDUCTION: This exam was performed according to the departmental dose-optimization program which includes automated exposure control, adjustment of the mA and/or kV according to patient size and/or use of iterative reconstruction technique. CONTRAST:  75mL OMNIPAQUE IOHEXOL 350 MG/ML SOLN COMPARISON:  None Available. FINDINGS: CTA CHEST FINDINGS Cardiovascular: No contour abnormality of the thoracic aorta suggest dissection or aneurysm. Great vessels normal. No pericardial fluid. Mediastinum/Nodes: No axillary or supraclavicular adenopathy. No mediastinal or hilar adenopathy. No pericardial  fluid. Esophagus normal. Lungs/Pleura: No pulmonary infarction. No pneumonia. No pleural fluid. No pneumothorax Musculoskeletal: No aggressive osseous lesion. Review of the MIP images confirms the above findings. CTA ABDOMEN AND PELVIS FINDINGS VASCULAR Aorta: Normal caliber aorta without aneurysm, dissection, vasculitis or significant stenosis. Celiac: Patent without evidence of aneurysm, dissection, vasculitis or significant stenosis. SMA: Patent without evidence of aneurysm, dissection, vasculitis or significant stenosis. Renals: Both renal arteries are patent without evidence of aneurysm, dissection, vasculitis, fibromuscular dysplasia or significant stenosis. IMA: Patent without evidence of aneurysm, dissection, vasculitis or significant stenosis. Inflow: Patent without evidence of aneurysm, dissection, vasculitis or significant stenosis. Veins: No obvious venous abnormality within the limitations of this arterial phase study. Review of the  MIP images confirms the above findings. NON-VASCULAR Knee Hepatobiliary: No focal hepatic lesion. Normal gallbladder. No biliary duct dilatation. Common bile duct is normal. Pancreas: Pancreas is normal. No ductal dilatation. No pancreatic inflammation. Spleen: Normal spleen Adrenals/urinary tract: Adrenal glands and kidneys are normal. The ureters and bladder normal. Stomach/Bowel: Stomach, small bowel, appendix, and cecum are normal. The colon and rectosigmoid colon are normal. Vascular/Lymphatic: Abdominal aorta is normal caliber. No periportal or retroperitoneal adenopathy. No pelvic adenopathy. Reproductive: Uterus and adnexa unremarkable. Other: No free fluid. Musculoskeletal: No aggressive osseous lesion. Review of the MIP images confirms the above findings. IMPRESSION: CHEST: 1. No aortic dissection or aneurysm. 2. No acute findings in the chest. PELVIS: 1. No aortic dissection or aneurysm. 2. No acute findings in the abdomen pelvis. Electronically Signed   By:  Genevive Bi M.D.   On: 08/22/2023 20:15   DG Chest 2 View  Result Date: 08/22/2023 CLINICAL DATA:  Chest pain beginning last night. EXAM: CHEST - 2 VIEW COMPARISON:  None Available. FINDINGS: The heart size and mediastinal contours are within normal limits. Both lungs are clear. The visualized skeletal structures are unremarkable. IMPRESSION: No active cardiopulmonary disease. Electronically Signed   By: Danae Orleans M.D.   On: 08/22/2023 17:17    Procedures Procedures    Medications Ordered in ED Medications  lactated ringers bolus 1,000 mL ( Intravenous Stopped 08/22/23 1929)  metoCLOPramide (REGLAN) injection 10 mg (10 mg Intravenous Given 08/22/23 1748)  iohexol (OMNIPAQUE) 350 MG/ML injection 75 mL (75 mLs Intravenous Contrast Given 08/22/23 1825)  iohexol (OMNIPAQUE) 350 MG/ML injection 75 mL (75 mLs Intravenous Contrast Given 08/22/23 1827)  ketorolac (TORADOL) 15 MG/ML injection 15 mg (15 mg Intravenous Given 08/22/23 2125)  prochlorperazine (COMPAZINE) injection 5 mg (5 mg Intravenous Given 08/22/23 2125)  potassium chloride (KLOR-CON) packet 40 mEq (40 mEq Oral Given 08/22/23 2125)    ED Course/ Medical Decision Making/ A&P                                 Medical Decision Making Amount and/or Complexity of Data Reviewed Labs: ordered. Radiology: ordered.  Risk Prescription drug management.   Medical Decision Making:   Lindsey Austin is a 52 y.o. female who presented to the ED today with headache, subacute numbness of the left pinky and left toes, as well as new chest pain.  Vital signs reviewed noted for hypertension.  EKG without acute ischemic changes.  Differential is broad given multiple nonspecific symptoms.  She does report chest pain rating to her back with recent neurologic changes and severe headache, differential including dissection, aneurysm.  Will evaluate for ACS as well.  I considered CNS infection however no fever and time course of symptoms and exam are less  consistent with this.  Low suspicion for PE, no tachycardia, shortness of breath, pleuritic pain or DVT symptoms.  She has no objective neurologic changes on exam but does report some subjective sensory changes over her left pinky and left toes which been going on for several weeks.  Seems less consistent with acute stroke especially with all of her other symptoms.  Consider possible complicated migraine.   Patient placed on continuous vitals and telemetry monitoring while in ED which was reviewed periodically.  Reviewed and confirmed nursing documentation for past medical history, family history, social history.  Reassessment and Plan:   On initial reassessment she has slight improvement in her headache.  Additional medications  ordered.  Troponin negative x 2, reassuring as ACS.  BMP notable for mild hypokalemia, repletion ordered.  CTA head and neck unremarkable, no signs of aneurysm or other acute intracranial abnormality.  CTA dissection protocol without dissection or other abnormality as well.  After Toradol and Compazine, she is asymptomatic.  No headache.  She feels like the numbness she had in her toes and left pinky are resolved.  I suspect her symptoms are related to atypical migraine given resolution with headache treatment and she has history of migraines as well.  Her chest pain was atypical, vitals any signs of PE, dissection, ACS.  I think she is stable for outpatient management.  I gave her strict return precautions, and she was discharged in stable condition.   Patient's presentation is most consistent with acute presentation with potential threat to life or bodily function.           Final Clinical Impression(s) / ED Diagnoses Final diagnoses:  Other migraine without status migrainosus, not intractable  Atypical chest pain    Rx / DC Orders ED Discharge Orders     None         Laurence Spates, MD 08/22/23 2228

## 2023-10-25 DIAGNOSIS — M2550 Pain in unspecified joint: Secondary | ICD-10-CM | POA: Diagnosis not present

## 2023-10-25 DIAGNOSIS — M791 Myalgia, unspecified site: Secondary | ICD-10-CM | POA: Diagnosis not present

## 2023-10-25 DIAGNOSIS — R768 Other specified abnormal immunological findings in serum: Secondary | ICD-10-CM | POA: Diagnosis not present

## 2024-01-27 DIAGNOSIS — Z79899 Other long term (current) drug therapy: Secondary | ICD-10-CM | POA: Diagnosis not present

## 2024-01-27 DIAGNOSIS — R768 Other specified abnormal immunological findings in serum: Secondary | ICD-10-CM | POA: Diagnosis not present

## 2024-01-27 DIAGNOSIS — M2550 Pain in unspecified joint: Secondary | ICD-10-CM | POA: Diagnosis not present

## 2024-01-27 DIAGNOSIS — M791 Myalgia, unspecified site: Secondary | ICD-10-CM | POA: Diagnosis not present

## 2024-05-15 DIAGNOSIS — E039 Hypothyroidism, unspecified: Secondary | ICD-10-CM | POA: Diagnosis not present

## 2024-05-15 DIAGNOSIS — M549 Dorsalgia, unspecified: Secondary | ICD-10-CM | POA: Diagnosis not present

## 2024-05-15 DIAGNOSIS — R0981 Nasal congestion: Secondary | ICD-10-CM | POA: Diagnosis not present

## 2024-05-15 DIAGNOSIS — N10 Acute tubulo-interstitial nephritis: Secondary | ICD-10-CM | POA: Diagnosis not present

## 2024-05-15 DIAGNOSIS — J069 Acute upper respiratory infection, unspecified: Secondary | ICD-10-CM | POA: Diagnosis not present

## 2024-05-15 DIAGNOSIS — Z780 Asymptomatic menopausal state: Secondary | ICD-10-CM | POA: Diagnosis not present

## 2024-05-15 DIAGNOSIS — Z1331 Encounter for screening for depression: Secondary | ICD-10-CM | POA: Diagnosis not present

## 2024-05-15 DIAGNOSIS — R10819 Abdominal tenderness, unspecified site: Secondary | ICD-10-CM | POA: Diagnosis not present

## 2024-05-15 DIAGNOSIS — J029 Acute pharyngitis, unspecified: Secondary | ICD-10-CM | POA: Diagnosis not present

## 2024-05-17 DIAGNOSIS — Z780 Asymptomatic menopausal state: Secondary | ICD-10-CM | POA: Diagnosis not present

## 2024-05-17 DIAGNOSIS — G43109 Migraine with aura, not intractable, without status migrainosus: Secondary | ICD-10-CM | POA: Diagnosis not present

## 2024-05-17 DIAGNOSIS — M069 Rheumatoid arthritis, unspecified: Secondary | ICD-10-CM | POA: Diagnosis not present

## 2024-05-17 DIAGNOSIS — Z0000 Encounter for general adult medical examination without abnormal findings: Secondary | ICD-10-CM | POA: Diagnosis not present

## 2024-05-17 DIAGNOSIS — I10 Essential (primary) hypertension: Secondary | ICD-10-CM | POA: Diagnosis not present

## 2024-08-05 DIAGNOSIS — R109 Unspecified abdominal pain: Secondary | ICD-10-CM | POA: Diagnosis not present

## 2024-08-05 DIAGNOSIS — K449 Diaphragmatic hernia without obstruction or gangrene: Secondary | ICD-10-CM | POA: Diagnosis not present

## 2024-08-05 DIAGNOSIS — R1030 Lower abdominal pain, unspecified: Secondary | ICD-10-CM | POA: Diagnosis not present

## 2024-08-05 DIAGNOSIS — R197 Diarrhea, unspecified: Secondary | ICD-10-CM | POA: Diagnosis not present

## 2024-08-05 DIAGNOSIS — R112 Nausea with vomiting, unspecified: Secondary | ICD-10-CM | POA: Diagnosis not present

## 2024-08-05 DIAGNOSIS — R140 Abdominal distension (gaseous): Secondary | ICD-10-CM | POA: Diagnosis not present

## 2024-08-05 DIAGNOSIS — Z20822 Contact with and (suspected) exposure to covid-19: Secondary | ICD-10-CM | POA: Diagnosis not present

## 2024-08-22 DIAGNOSIS — M791 Myalgia, unspecified site: Secondary | ICD-10-CM | POA: Diagnosis not present

## 2024-08-22 DIAGNOSIS — Z79899 Other long term (current) drug therapy: Secondary | ICD-10-CM | POA: Diagnosis not present

## 2024-08-22 DIAGNOSIS — R768 Other specified abnormal immunological findings in serum: Secondary | ICD-10-CM | POA: Diagnosis not present

## 2024-08-22 DIAGNOSIS — M2550 Pain in unspecified joint: Secondary | ICD-10-CM | POA: Diagnosis not present
